# Patient Record
Sex: Female | Born: 1980 | Hispanic: Yes | Marital: Single | Smoking: Never smoker
Health system: Southern US, Community
[De-identification: ages and names within clinical notes are randomized; demographics above are authoritative.]

## PROBLEM LIST (undated history)

## (undated) ENCOUNTER — Inpatient Hospital Stay (HOSPITAL_COMMUNITY): Payer: Medicaid Other

## (undated) DIAGNOSIS — E119 Type 2 diabetes mellitus without complications: Secondary | ICD-10-CM

## (undated) HISTORY — DX: Type 2 diabetes mellitus without complications: E11.9

---

## 2009-12-18 DIAGNOSIS — R739 Hyperglycemia, unspecified: Secondary | ICD-10-CM

## 2018-05-08 ENCOUNTER — Other Ambulatory Visit: Payer: Self-pay

## 2018-05-08 ENCOUNTER — Emergency Department (HOSPITAL_COMMUNITY)
Admission: EM | Admit: 2018-05-08 | Discharge: 2018-05-08 | Disposition: A | Discharge status: Home / Self Care | Payer: Medicaid Other | Attending: Emergency Medicine | Admitting: Emergency Medicine

## 2018-05-08 ENCOUNTER — Encounter (HOSPITAL_COMMUNITY): Payer: Self-pay | Admitting: Emergency Medicine

## 2018-05-08 DIAGNOSIS — R112 Nausea with vomiting, unspecified: Secondary | ICD-10-CM | POA: Insufficient documentation

## 2018-05-08 DIAGNOSIS — E119 Type 2 diabetes mellitus without complications: Secondary | ICD-10-CM | POA: Diagnosis not present

## 2018-05-08 HISTORY — DX: Type 2 diabetes mellitus without complications: E11.9

## 2018-05-08 MED ORDER — ONDANSETRON 4 MG PO TBDP
4.0000 mg | ORAL_TABLET | Freq: Once | ORAL | Status: AC
  Administered 2018-05-08: 4 mg via ORAL
  Filled 2018-05-08: qty 1

## 2018-05-08 MED ORDER — ONDANSETRON 4 MG PO TBDP: 4.0000 mg | ORAL_TABLET | Freq: Three times a day (TID) | ORAL | 0 refills | Status: DC | PRN

## 2018-05-08 NOTE — ED Notes (Signed)
Pt also states she has been having nose bleeds and a headache. Pt has history of diabetes

## 2018-05-08 NOTE — Discharge Instructions (Signed)
Please read instructions below. Drink clear liquids until your stomach feels better. Then, slowly introduce bland foods into your diet as tolerated, such as bread, rice, apples, bananas. You can take zofran every 8 hours as needed for nausea. Follow up with your primary care if symptoms persist. Return to the ER for severe abdominal pain, fever, uncontrollable vomiting, or new or concerning symptoms.  Por favor, lea las instrucciones a continuacin. Beba lquidos claros hasta que su estmago se sienta mejor. Luego, introduzca lentamente alimentos suaves en su dieta segn lo tolerado, como pan, arroz, manzanas, pltanos. Puede tomar zofran cada 8 horas segn sea necesario para las nuseas. Haga un seguimiento con su atencin primaria si los sntomas persisten. Regrese a la sala de emergencias por dolor abdominal intenso, fiebre, vmitos incontrolables o sntomas nuevos o preocupantes.

## 2018-05-08 NOTE — ED Notes (Signed)
Pt able to tolerate fluids 

## 2018-05-08 NOTE — ED Provider Notes (Signed)
[facility] EMERGENCY DEPARTMENT Provider Note   CSN: [id number] Arrival date & time: 05/08/18  1101     History   Chief Complaint No chief complaint on file.   HPI Lisa [name] with past medical history of DM, presenting to the emergency department with complaint of 1 day of nausea and vomiting.  Patient states yesterday she began having nausea with associated nonbloody nonbilious emesis.  She has associated epigastric abdominal discomfort which she states feels like reflux.  She is treated symptoms with Tylenol with improvement.  She endorses a fever, however states her temperature was 98.44F.  No hx abdominal surgeries. Denies diarrhea, urinary sx. No sick contacts.   The history is provided by the patient.    Past Medical History:  Diagnosis Date  . Diabetes mellitus without complication (HCC)     There are no active problems to display for this patient.   History reviewed. No pertinent surgical history.   OB History   No obstetric history on file.      Home Medications    Prior to Admission medications   Medication Sig Start Date End Date Taking? Authorizing Provider  ondansetron (ZOFRAN ODT) 4 MG disintegrating tablet Take 1 tablet (4 mg total) by mouth every 8 (eight) hours as needed for nausea or vomiting. 05/08/18   Brecklyn Galvis, [place] N, PA-C    Family History No family history on file.  Social History Social History   Tobacco Use  . Smoking status: Not on file  Substance Use Topics  . Alcohol use: Not on file  . Drug use: Not on file     Allergies   Patient has no allergy information on record.   Review of Systems Review of Systems  Constitutional: Negative for fever.  Gastrointestinal: Positive for abdominal pain, nausea and vomiting. Negative for diarrhea.  Genitourinary: Negative for dysuria and frequency.  All other systems reviewed and are negative.    Physical Exam Updated Vital  Signs BP 108/82   Pulse 88   Temp 98.5 F (36.9 C) (Oral)   Ht 5\' 6"  (1.676 m)   Wt 61.2 kg   SpO2 100%   BMI 21.79 kg/m   Physical Exam Vitals signs and nursing note reviewed.  Constitutional:      General: She is not in acute distress.    Appearance: She is well-developed. She is not ill-appearing.  HENT:     Head: Normocephalic and atraumatic.     Mouth/Throat:     Mouth: Mucous membranes are moist.  Eyes:     Conjunctiva/sclera: Conjunctivae normal.  Cardiovascular:     Rate and Rhythm: Normal rate and regular rhythm.  Pulmonary:     Effort: Pulmonary effort is normal.     Breath sounds: Normal breath sounds.  Abdominal:     General: Bowel sounds are normal. There is no distension.     Palpations: Abdomen is soft. There is no mass.     Tenderness: There is abdominal tenderness (generalized). There is no guarding or rebound.  Skin:    General: Skin is warm.  Neurological:     Mental Status: She is alert.  Psychiatric:        Behavior: Behavior normal.      ED Treatments / Results  Labs (all labs ordered are listed, but only abnormal results are displayed) Labs Reviewed - No data to display  EKG None  Radiology No results found.  Procedures Procedures (including critical  care time)  Medications Ordered in ED Medications  ondansetron (ZOFRAN-ODT) disintegrating tablet 4 mg (4 mg Oral Given 05/08/18 1246)     Initial Impression / Assessment and Plan / ED Course  I have reviewed the triage vital signs and the nursing notes.  Pertinent labs & imaging results that were available during my care of the patient were reviewed by me and considered in my medical decision making (see chart for details).     Patient with symptoms consistent with viral gastroenteritis.  Vitals are stable, no fever.  No signs of dehydration, tolerating PO fluids > 6 oz.  Lungs are clear.  No focal abdominal tenderness, no concern for appendicitis, cholecystitis, pancreatitis,  ruptured viscus, UTI, kidney stone, or any other abdominal etiology.  Supportive therapy indicated with return if symptoms worsen.  Patient counseled.  Discussed results, findings, treatment and follow up. Patient advised of return precautions. Patient verbalized understanding and agreed with plan.  Final Clinical Impressions(s) / ED Diagnoses   Final diagnoses:  Non-intractable vomiting with nausea, unspecified vomiting type    ED Discharge Orders         Ordered    ondansetron (ZOFRAN ODT) 4 MG disintegrating tablet  Every 8 hours PRN     05/08/18 1351           Eulis Salazar, [place] N, PA-C 05/08/18 1359    Azalia Bilis, MD 05/08/18 1642

## 2018-05-08 NOTE — ED Triage Notes (Signed)
Pt presents to ED from home. Pt complains of of N/V and fever since last night. Pt complains of no pain. Pt states her fever was 98.6.

## 2018-05-08 NOTE — ED Notes (Signed)
Pt given ginger ale to drink. 

## 2018-05-08 NOTE — ED Notes (Signed)
Patient verbalizes understanding of discharge instructions. Opportunity for questioning and answers were provided. Armband removed by staff, pt discharged from ED ambulatory to home.  

## 2018-05-29 ENCOUNTER — Ambulatory Visit (INDEPENDENT_AMBULATORY_CARE_PROVIDER_SITE_OTHER): Payer: Medicaid Other | Admitting: *Deleted

## 2018-05-29 ENCOUNTER — Telehealth: Payer: Self-pay | Admitting: General Practice

## 2018-05-29 ENCOUNTER — Encounter: Payer: Self-pay | Admitting: General Practice

## 2018-05-29 VITALS — BP 124/81 | HR 99 | Temp 97.5°F | Ht 66.0 in | Wt 141.4 lb

## 2018-05-29 DIAGNOSIS — Z3200 Encounter for pregnancy test, result unknown: Secondary | ICD-10-CM

## 2018-05-29 DIAGNOSIS — Z3201 Encounter for pregnancy test, result positive: Secondary | ICD-10-CM | POA: Diagnosis not present

## 2018-05-29 LAB — POCT URINE PREGNANCY: Preg Test, Ur: POSITIVE — AB

## 2018-05-29 NOTE — Telephone Encounter (Signed)
Charity application and information about [employer] program given to patient.

## 2018-05-29 NOTE — Progress Notes (Signed)
   [name] presents today for UPT. She has no unusual complaints. LMP:04/09/2018    OBJECTIVE: Appears well, in no apparent distress.  OB History    Gravida  3   Para  2   Term  1   Preterm  1   AB      Living  2     SAB      TAB      Ectopic      Multiple      Live Births  2          Home UPT Result: Positive In-Office UPT result:Positive I have reviewed the patient's medical, obstetrical, social, and family histories, and medications.   ASSESSMENT: Positive pregnancy test  PLAN Prenatal care to be completed at: [facility] due to history of DM with insulin administration. Start PNV.  Lisa Pu, RN

## 2018-06-13 ENCOUNTER — Ambulatory Visit: Payer: Medicaid Other | Admitting: *Deleted

## 2018-06-13 ENCOUNTER — Encounter: Payer: Medicaid Other | Attending: Obstetrics and Gynecology | Admitting: *Deleted

## 2018-06-13 ENCOUNTER — Other Ambulatory Visit: Payer: Self-pay | Admitting: Obstetrics and Gynecology

## 2018-06-13 DIAGNOSIS — O24410 Gestational diabetes mellitus in pregnancy, diet controlled: Secondary | ICD-10-CM

## 2018-06-13 DIAGNOSIS — O24011 Pre-existing diabetes mellitus, type 1, in pregnancy, first trimester: Secondary | ICD-10-CM

## 2018-06-13 DIAGNOSIS — Z713 Dietary counseling and surveillance: Secondary | ICD-10-CM | POA: Diagnosis not present

## 2018-06-13 MED ORDER — ACCU-CHEK GUIDE W/DEVICE KIT: 1.0000 | PACK | Freq: Once | 0 refills | Status: AC

## 2018-06-13 MED ORDER — GLUCOSE BLOOD VI STRP: ORAL_STRIP | 12 refills | Status: AC

## 2018-06-13 MED ORDER — ACCU-CHEK FASTCLIX LANCETS MISC: 1.0000 | Freq: Four times a day (QID) | 12 refills | Status: DC

## 2018-06-13 MED ORDER — INSULIN GLARGINE 100 UNIT/ML ~~LOC~~ SOLN: 35.0000 [IU] | Freq: Every day | SUBCUTANEOUS | 11 refills | Status: DC

## 2018-06-13 MED ORDER — "INSULIN SYRINGE 30G X 5/16"" 0.5 ML MISC": 1.0000 | 5 refills | Status: DC

## 2018-06-13 MED ORDER — INSULIN PEN NEEDLE 30G X 8 MM MISC: 1.0000 | Status: AC | PRN

## 2018-06-14 NOTE — Progress Notes (Signed)
Patient was seen on 06/13/2018 for type 1 Diabetes and pregnancy self-management. EDD 01/14/2019. Patient speaks Spanish, we started appointment with [facility] interpretor, then live interpretor was able to complete visit.  Patient states history of this diabetes for 13 years.  Diet history obtained. Patient eats good variety of all food groups and beverages include coffee with cream and water.  Her comfort level with carb counting is 5/10. Patient is currently on 35 units Lantus / Basaglar insulin at night for her diabetes medications. She states she works for a [employer], [employer] from 8 - 5 PM so she has moderate activity all day. She states she has hypoglycemia about once a week or less lately.  She recently moved here from [place] and lost her meter, she states she hasn't checked her blood sugar in about 2 months. 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 She does have Medicaid, and Lantus is covered so I will discuss with MD today about switching back to Lantus. When asked about any history of DKA, she did not know what that meant. Once I described it, she said no history.  The following learning objectives were met by the patient :   States the definition of type 1 Diabetes and pregnancy   States why dietary management is important in controlling blood glucose  Describes the effects of carbohydrates on blood glucose levels  Demonstrates ability to create a balanced meal plan  Demonstrates carbohydrate counting   States when to check blood glucose levels  Demonstrates proper blood glucose monitoring techniques  States the effect of stress and exercise on blood glucose levels  States the importance of limiting caffeine and abstaining from alcohol and smoking  I discussed with Dr. Arlina Robes her need for a Rx for insulin, he placed order for Lantus pens with pen needles @ 35 units at night.  She states she is aware of pumps but  never used one. I mentioned possibility of Omnipod, but plan to discuss in more detail at future visit if she wishes.   Plan:   Aim for 3 Carb Choices per meal (45 grams) +/- 1 either way   Aim for 1-2 Carbs per snack  Begin reading food labels for Total Carbohydrate of foods  Consider  increasing your activity level by walking or other activity daily as tolerated  Begin checking BG before breakfast and 2 hours after first bite of breakfast, lunch and dinner as directed by MD   Bring Log Book/Sheet to every medical appointment    Patient not appropriate for Baby Scripts due to having type 1 diabetes  Take medication as directed by MD  Blood glucose monitor Rx called into pharmacy: [place] with Fast Clix drums Patient instructed to test pre breakfast and 2 hours each meal as directed by MD  Patient instructed to monitor glucose levels: FBS: 60 - 95 mg/dl 2 hour: <120 mg/dl  Patient received the following handouts:  Nutrition Diabetes and Pregnancy  Carbohydrate Counting List  Patient will be seen for follow-up in 1 month and as needed.

## 2018-06-21 ENCOUNTER — Emergency Department (HOSPITAL_COMMUNITY)
Admission: EM | Admit: 2018-06-21 | Discharge: 2018-06-22 | Disposition: A | Discharge status: Home / Self Care | Payer: Medicaid Other | Attending: Emergency Medicine | Admitting: Emergency Medicine

## 2018-06-21 ENCOUNTER — Encounter (HOSPITAL_COMMUNITY): Payer: Self-pay | Admitting: *Deleted

## 2018-06-21 ENCOUNTER — Other Ambulatory Visit: Payer: Self-pay

## 2018-06-21 DIAGNOSIS — O23591 Infection of other part of genital tract in pregnancy, first trimester: Secondary | ICD-10-CM | POA: Diagnosis not present

## 2018-06-21 DIAGNOSIS — O24111 Pre-existing diabetes mellitus, type 2, in pregnancy, first trimester: Secondary | ICD-10-CM | POA: Insufficient documentation

## 2018-06-21 DIAGNOSIS — Z794 Long term (current) use of insulin: Secondary | ICD-10-CM | POA: Diagnosis not present

## 2018-06-21 DIAGNOSIS — B9689 Other specified bacterial agents as the cause of diseases classified elsewhere: Secondary | ICD-10-CM | POA: Insufficient documentation

## 2018-06-21 DIAGNOSIS — E119 Type 2 diabetes mellitus without complications: Secondary | ICD-10-CM | POA: Insufficient documentation

## 2018-06-21 DIAGNOSIS — Z3A12 12 weeks gestation of pregnancy: Secondary | ICD-10-CM | POA: Insufficient documentation

## 2018-06-21 DIAGNOSIS — R1032 Left lower quadrant pain: Secondary | ICD-10-CM

## 2018-06-21 DIAGNOSIS — N760 Acute vaginitis: Secondary | ICD-10-CM

## 2018-06-21 LAB — CBG MONITORING, ED: Glucose-Capillary: 91 mg/dL (ref 70–99)

## 2018-06-21 NOTE — ED Notes (Signed)
Pt remains in waiting room. Updated on wait for treatment room. 

## 2018-06-21 NOTE — ED Triage Notes (Addendum)
Pt was at work. Pt lost her footing at work and caught herself with her leg, did not fall onto her abd but has been having pain since incident. Pt is 2 months [redacted] weeks pregnant. Denies vaginal bleeding or abnormal discharge

## 2018-06-22 ENCOUNTER — Emergency Department (HOSPITAL_COMMUNITY): Payer: Medicaid Other

## 2018-06-22 LAB — COMPREHENSIVE METABOLIC PANEL
ALT: 16 U/L (ref 0–44)
AST: 18 U/L (ref 15–41)
Albumin: 3.2 g/dL — ABNORMAL LOW (ref 3.5–5.0)
Alkaline Phosphatase: 38 U/L (ref 38–126)
Anion gap: 8 (ref 5–15)
BUN: 5 mg/dL — ABNORMAL LOW (ref 6–20)
CHLORIDE: 105 mmol/L (ref 98–111)
CO2: 20 mmol/L — ABNORMAL LOW (ref 22–32)
Calcium: 8.7 mg/dL — ABNORMAL LOW (ref 8.9–10.3)
Creatinine, Ser: 0.52 mg/dL (ref 0.44–1.00)
GFR calc Af Amer: >60 mL/min (ref >60)
GFR calc non Af Amer: >60 mL/min (ref >60)
GLUCOSE: 39 mg/dL — AB (ref 70–99)
Potassium: 3.3 mmol/L — ABNORMAL LOW (ref 3.5–5.1)
Sodium: 133 mmol/L — ABNORMAL LOW (ref 135–145)
Total Bilirubin: 0.5 mg/dL (ref 0.3–1.2)
Total Protein: 6.9 g/dL (ref 6.5–8.1)

## 2018-06-22 LAB — URINALYSIS, ROUTINE W REFLEX MICROSCOPIC
Bilirubin Urine: NEGATIVE
Glucose, UA: NEGATIVE mg/dL
Hgb urine dipstick: NEGATIVE
Ketones, ur: NEGATIVE mg/dL
Leukocytes,Ua: NEGATIVE
Nitrite: NEGATIVE
Protein, ur: NEGATIVE mg/dL
SPECIFIC GRAVITY, URINE: 1.004 — AB (ref 1.005–1.030)
pH: 7 (ref 5.0–8.0)

## 2018-06-22 LAB — CBC WITH DIFFERENTIAL/PLATELET
Abs Immature Granulocytes: 0.02 10*3/uL (ref 0.00–0.07)
Basophils Absolute: 0.1 10*3/uL (ref 0.0–0.1)
Basophils Relative: 1 %
Eosinophils Absolute: 0.2 10*3/uL (ref 0.0–0.5)
Eosinophils Relative: 2 %
HCT: 29 % — ABNORMAL LOW (ref 36.0–46.0)
Hemoglobin: 8.4 g/dL — ABNORMAL LOW (ref 12.0–15.0)
IMMATURE GRANULOCYTES: 0 %
Lymphocytes Relative: 18 %
Lymphs Abs: 1.7 10*3/uL (ref 0.7–4.0)
MCH: 18.8 pg — ABNORMAL LOW (ref 26.0–34.0)
MCHC: 29 g/dL — ABNORMAL LOW (ref 30.0–36.0)
MCV: 64.9 fL — ABNORMAL LOW (ref 80.0–100.0)
Monocytes Absolute: 1.4 10*3/uL — ABNORMAL HIGH (ref 0.1–1.0)
Monocytes Relative: 15 %
Neutro Abs: 5.8 10*3/uL (ref 1.7–7.7)
Neutrophils Relative %: 64 %
PLATELETS: 210 10*3/uL (ref 150–400)
RBC: 4.47 MIL/uL (ref 3.87–5.11)
RDW: 21.3 % — ABNORMAL HIGH (ref 11.5–15.5)
WBC: 9.1 10*3/uL (ref 4.0–10.5)
nRBC: 0 % (ref 0.0–0.2)

## 2018-06-22 LAB — ABO/RH
ABO/RH(D): A NEG
ANTIBODY SCREEN: NEGATIVE

## 2018-06-22 LAB — WET PREP, GENITAL
Sperm: NONE SEEN
Trich, Wet Prep: NONE SEEN
Yeast Wet Prep HPF POC: NONE SEEN

## 2018-06-22 LAB — CBG MONITORING, ED: Glucose-Capillary: 153 mg/dL — ABNORMAL HIGH (ref 70–99)

## 2018-06-22 MED ORDER — METRONIDAZOLE 500 MG PO TABS: 500.0000 mg | ORAL_TABLET | Freq: Two times a day (BID) | ORAL | 0 refills | Status: DC

## 2018-06-22 MED ORDER — METRONIDAZOLE 500 MG PO TABS: 2000.0000 mg | ORAL_TABLET | Freq: Once | ORAL | Status: DC

## 2018-06-22 MED ORDER — ACETAMINOPHEN 500 MG PO TABS
1000.0000 mg | ORAL_TABLET | Freq: Once | ORAL | Status: AC
  Administered 2018-06-22: 1000 mg via ORAL
  Filled 2018-06-22: qty 2

## 2018-06-22 MED ORDER — METRONIDAZOLE 500 MG PO TABS
500.0000 mg | ORAL_TABLET | Freq: Once | ORAL | Status: AC
  Administered 2018-06-22: 500 mg via ORAL
  Filled 2018-06-22: qty 1

## 2018-06-22 NOTE — ED Provider Notes (Addendum)
[facility] EMERGENCY DEPARTMENT Provider Note  CSN: [id number] Arrival date & time: 06/21/18 1835  Chief Complaint(s) Abdominal Pain  HPI Lisa [name] g3p2 at approx 10wk who presents to the emergency department with left-sided abdominal pain.  She reports that while walking she missed a step which caused her to her trip.  Incident occurred at approximately 1500 this afternoon.  She states that she was able to catch herself but strained while doing so.  Since then her abdomen has been aching.  Pain is moderate in intensity.  Exacerbated with movement and palpation.  She endorses associated nausea and nonbloody nonbilious emesis.  She denies any vaginal bleeding.  No urinary symptoms.  No chest pain or shortness of breath.  She reports that she is scheduled for her first OB visit next week.  Denies any prior sexual transmitted disease.  Less sexual relations was earlier this morning.  HPI  Past Medical History Past Medical History:  Diagnosis Date  . Diabetes mellitus without complication (HCC)    There are no active problems to display for this patient.  Home Medication(s) Prior to Admission medications   Medication Sig Start Date End Date Taking? Authorizing Provider  insulin glargine (LANTUS) 100 UNIT/ML injection Inject 0.35 mLs (35 Units total) into the skin daily. 06/13/18  Yes Hermina Staggers, MD  ACCU-CHEK FASTCLIX LANCETS MISC 1 Device by Percutaneous route 4 (four) times daily. 06/13/18   Hermina Staggers, MD  glucose blood (ACCU-CHEK GUIDE) test strip Use as instructed QID 06/13/18   Hermina Staggers, MD  Insulin Syringe-Needle U-100 (INSULIN SYRINGE .5CC/30GX5/16") 30G X 5/16" 0.5 ML MISC 1 Device by Does not apply route as directed. 06/13/18   Hermina Staggers, MD  metroNIDAZOLE (FLAGYL) 500 MG tablet Take 1 tablet (500 mg total) by mouth 2 (two) times daily. 06/22/18   Nira Conn, MD  ondansetron (ZOFRAN ODT) 4 MG  disintegrating tablet Take 1 tablet (4 mg total) by mouth every 8 (eight) hours as needed for nausea or vomiting. Patient not taking: Reported on 06/22/2018 05/08/18   Robinson, [place] N, PA-C                                                                                                                                    Past Surgical History History reviewed. No pertinent surgical history. Family History No family history on file.  Social History Social History   Tobacco Use  . Smoking status: Never Smoker  . Smokeless tobacco: Never Used  Substance Use Topics  . Alcohol use: Never    Frequency: Never  . Drug use: Never   Allergies Penicillins  Review of Systems Review of Systems All other systems are reviewed and are negative for acute change except as noted in the HPI  Physical Exam Vital Signs  I have reviewed the triage vital signs BP 125/74 (BP  Location: Right Arm)   Pulse (!) 102   Temp 98 F (36.7 C) (Oral)   Resp 16   LMP 04/09/2018 (Exact Date)   SpO2 100%    Physical Exam Vitals signs reviewed. Exam conducted with a chaperone present.  Constitutional:      General: She is not in acute distress.    Appearance: She is well-developed. She is not diaphoretic.  HENT:     Head: Normocephalic and atraumatic.     Right Ear: External ear normal.     Left Ear: External ear normal.     Nose: Nose normal.  Eyes:     General: No scleral icterus.    Conjunctiva/sclera: Conjunctivae normal.  Neck:     Musculoskeletal: Normal range of motion.     Trachea: Phonation normal.  Cardiovascular:     Rate and Rhythm: Normal rate and regular rhythm.  Pulmonary:     Effort: Pulmonary effort is normal. No respiratory distress.     Breath sounds: No stridor.  Abdominal:     General: There is no distension.     Tenderness: There is abdominal tenderness in the suprapubic area and left lower quadrant. There is no guarding or rebound.  Genitourinary:    Vagina: No foreign  body. Vaginal discharge (mucous, white) present. No erythema, bleeding or lesions.     Cervix: Normal.     Uterus: Normal.      Adnexa: Right adnexa normal and left adnexa normal.  Musculoskeletal: Normal range of motion.  Neurological:     Mental Status: She is alert and oriented to person, place, and time.  Psychiatric:        Behavior: Behavior normal.     ED Results and Treatments Labs (all labs ordered are listed, but only abnormal results are displayed) Labs Reviewed  WET PREP, GENITAL - Abnormal; Notable for the following components:      Result Value   Clue Cells Wet Prep HPF POC PRESENT (*)    WBC, Wet Prep HPF POC MODERATE (*)    All other components within normal limits  CBC WITH DIFFERENTIAL/PLATELET - Abnormal; Notable for the following components:   Hemoglobin 8.4 (*)    HCT 29.0 (*)    MCV 64.9 (*)    MCH 18.8 (*)    MCHC 29.0 (*)    RDW 21.3 (*)    Monocytes Absolute 1.4 (*)    All other components within normal limits  COMPREHENSIVE METABOLIC PANEL - Abnormal; Notable for the following components:   Sodium 133 (*)    Potassium 3.3 (*)    CO2 20 (*)    Glucose, Bld 39 (*)    BUN 5 (*)    Calcium 8.7 (*)    Albumin 3.2 (*)    All other components within normal limits  URINALYSIS, ROUTINE W REFLEX MICROSCOPIC - Abnormal; Notable for the following components:   Color, Urine STRAW (*)    Specific Gravity, Urine 1.004 (*)    All other components within normal limits  CBG MONITORING, ED - Abnormal; Notable for the following components:   Glucose-Capillary 153 (*)    All other components within normal limits  CBG MONITORING, ED  ABO/RH  GC/CHLAMYDIA PROBE AMP ([place]) NOT AT [facility]  EKG  EKG Interpretation  Date/Time:    Ventricular Rate:    PR Interval:    QRS Duration:   QT Interval:    QTC Calculation:   R Axis:     Text  Interpretation:        Radiology [place] Art/ven Flow Abd Pelv Doppler  Result Date: 06/22/2018 CLINICAL DATA:  38 y/o F; positive urine pregnancy test. Pelvic pain. EXAM: OBSTETRIC <14 WK [place] AND TRANSVAGINAL OB US DOPPLER ULTRASOUND OF OVARIES TECHNIQUE: Both transabdominal and transvaginal ultrasound examinations were performed for complete evaluation of the gestation as well as the maternal uterus, adnexal regions, and pelvic cul-de-sac. Transvaginal technique was performed to assess early pregnancy. Color and duplex Doppler ultrasound was utilized to evaluate blood flow to the ovaries. COMPARISON:  None. FINDINGS: Intrauterine gestational sac: Single Yolk sac:  Not Visualized. Embryo:  Visualized. Cardiac Activity: Visualized. Heart Rate: 171 bpm CRL: 60.6 mm   12 w   4 d                  [place] EDC: 12/31/2018 Subchorionic hemorrhage:  None visualized. Maternal uterus/adnexae: Right ovary measures 3.3 x 2.5 x 2.8 cm, 11.9 cc. Left ovary measures 2.8 x 1.7 x 2.3 cm, 5.6 cc. Normal appearance of the adnexa and uterus. Pulsed Doppler evaluation of both ovaries demonstrates normal appearing low-resistance arterial and venous waveforms. IMPRESSION: Single live intrauterine pregnancy with estimated gestational age of [redacted] weeks and 4 days. Electronically Signed   By: Mitzi Hansen M.D.   On: 06/22/2018 02:43   Pertinent labs & imaging results that were available during my care of the patient were reviewed by me and considered in my medical decision making (see chart for details).  Medications Ordered in ED Medications  acetaminophen (TYLENOL) tablet 1,000 mg (1,000 mg Oral Given 06/22/18 0146)  metroNIDAZOLE (FLAGYL) tablet 500 mg (500 mg Oral Given 06/22/18 0432)                                                                                                                                    Procedures Procedures EMERGENCY DEPARTMENT [place] PREGNANCY "Study: Limited Ultrasound of the Pelvis for  Pregnancy"  INDICATIONS:Pregnancy(required) Multiple views of the uterus and pelvic cavity were obtained in real-time with a multi-frequency probe.  APPROACH:Transabdominal  PERFORMED BY: Myself IMAGES ARCHIVED?: Yes LIMITATIONS: none PREGNANCY FREE FLUID: None FETAL HEART RATE: 150 INTERPRETATION: active fetal heart movement and heart tones     (including critical care time)  Medical Decision Making / ED Course I have reviewed the nursing notes for this encounter and the patient's prior records (if available in EHR or on provided paperwork).    Patient presents with left-sided abdominal pain after straining to prevent herself from falling.  No direct trauma to the abdomen.  On exam patient has discomfort to the suprapubic region and left lower quadrant.  On pelvic exam there is no vaginal bleeding.  There is some white mucus discharge.  Cervical office is closed.  Exam not consistent with PID.  Bedside ultrasound revealed active fetal movement with fetal heart tones in the 150s.  Screening labs obtained and grossly reassuring without leukocytosis.  Patient does have microcytic anemia (no prior CBC for comparison).  Mild hyponatremia and hypokalemia on metabolic panel.  No renal insufficiency.  Urine without evidence of infection and no hematuria.  Wet prep with evidence of bacterial vaginosis.  Will treat with Flagyl.  Low suspicion for serious intra-abdominal inflammatory/infectious process.  Will obtain pelvic ultrasound to assess for possible torsion.  Pelvic ultrasound without ovarian torsion or TOA.  During the work-up it was noted the patient was hypoglycemic provided with food and CBGs improved. Tolerated PO intake.  Final Clinical Impression(s) / ED Diagnoses Final diagnoses:  LLQ pain  Bacterial vaginosis    Disposition: Discharge  Condition: Good  I have discussed the results, Dx and Tx plan with the patient who expressed understanding and agree(s) with the plan.  Discharge instructions discussed at great length. The patient was given strict return precautions who verbalized understanding of the instructions. No further questions at time of discharge.    ED Discharge Orders         Ordered    metroNIDAZOLE (FLAGYL) 500 MG tablet  2 times daily     06/22/18 0531           Follow Up: Obstetrician   as scheduled     This chart was dictated using voice recognition software.  Despite best efforts to proofread,  errors can occur which can change the documentation meaning.     Nira Conn, MD 06/22/18 [phone]

## 2018-06-24 LAB — GC/CHLAMYDIA PROBE AMP (~~LOC~~) NOT AT ARMC
Chlamydia: NEGATIVE
Neisseria Gonorrhea: NEGATIVE

## 2018-06-28 ENCOUNTER — Encounter: Payer: Self-pay | Admitting: Obstetrics & Gynecology

## 2018-06-28 ENCOUNTER — Ambulatory Visit (INDEPENDENT_AMBULATORY_CARE_PROVIDER_SITE_OTHER): Payer: Medicaid Other | Admitting: Obstetrics & Gynecology

## 2018-06-28 VITALS — BP 127/86 | HR 101 | Wt 142.9 lb

## 2018-06-28 DIAGNOSIS — Z3A13 13 weeks gestation of pregnancy: Secondary | ICD-10-CM | POA: Diagnosis not present

## 2018-06-28 DIAGNOSIS — Z789 Other specified health status: Secondary | ICD-10-CM | POA: Insufficient documentation

## 2018-06-28 DIAGNOSIS — O219 Vomiting of pregnancy, unspecified: Secondary | ICD-10-CM | POA: Diagnosis not present

## 2018-06-28 DIAGNOSIS — O24311 Unspecified pre-existing diabetes mellitus in pregnancy, first trimester: Secondary | ICD-10-CM

## 2018-06-28 DIAGNOSIS — O09529 Supervision of elderly multigravida, unspecified trimester: Secondary | ICD-10-CM | POA: Insufficient documentation

## 2018-06-28 DIAGNOSIS — O09521 Supervision of elderly multigravida, first trimester: Secondary | ICD-10-CM

## 2018-06-28 DIAGNOSIS — Z23 Encounter for immunization: Secondary | ICD-10-CM | POA: Diagnosis not present

## 2018-06-28 DIAGNOSIS — O0991 Supervision of high risk pregnancy, unspecified, first trimester: Secondary | ICD-10-CM

## 2018-06-28 DIAGNOSIS — O0990 Supervision of high risk pregnancy, unspecified, unspecified trimester: Secondary | ICD-10-CM | POA: Insufficient documentation

## 2018-06-28 DIAGNOSIS — O24319 Unspecified pre-existing diabetes mellitus in pregnancy, unspecified trimester: Secondary | ICD-10-CM

## 2018-06-28 MED ORDER — METOCLOPRAMIDE HCL 10 MG PO TABS: 10.0000 mg | ORAL_TABLET | Freq: Four times a day (QID) | ORAL | 2 refills | Status: DC | PRN

## 2018-06-28 MED ORDER — PROMETHAZINE HCL 25 MG PO TABS: 25.0000 mg | ORAL_TABLET | Freq: Four times a day (QID) | ORAL | 2 refills | Status: DC | PRN

## 2018-06-28 MED ORDER — ASPIRIN EC 81 MG PO TBEC: 81.0000 mg | DELAYED_RELEASE_TABLET | Freq: Every day | ORAL | 2 refills | Status: DC

## 2018-06-28 NOTE — Progress Notes (Signed)
History:   Lisa Blake is a 38 y.o. G3P2002 at [redacted]w[redacted]d by 12 week ultrasound not consistent with LMP being seen today for her first obstetrical visit. Patient is Spanish-speaking only, Spanish interpreter present for this encounter. Her obstetrical history is significant for Type II DM, insulin dependent and two previous cesarean sections. Patient does intend to breast feed. Pregnancy history fully reviewed.  Patient reports nausea and vomiting, desires medication for this.     HISTORY: OB History  Gravida Para Term Preterm AB Living  3 2 1 1  0 2  SAB TAB Ectopic Multiple Live Births  0 0 0 0 2    # Outcome Date GA Lbr Len/2nd Weight Sex Delivery Anes PTL Lv  3 Current           2 Preterm 12/18/09 [redacted]w[redacted]d   M CS-LTranv  N LIV     Complications: Hyperglycemia  1 Term 12/16/04 [redacted]w[redacted]d   F CS-LTranv  N LIV    [place] pap smear [place] in [place] last year and was normal  Past Medical History:  Diagnosis Date  . Diabetes mellitus without complication (HCC)   . DM II (diabetes mellitus, type II), controlled (HCC)    Diagnosed after pregnancy, was 38 years old   Past Surgical History:  Procedure Laterality Date  . CESAREAN SECTION     Family History  Problem Relation Age of Onset  . Hypertension Mother   . Diabetes Father    Social History   Tobacco Use  . Smoking status: Never Smoker  . Smokeless tobacco: Never Used  Substance Use Topics  . Alcohol use: Never    Frequency: Never  . Drug use: Never   Allergies  Allergen Reactions  . Penicillins     Unknown reaction   Current Outpatient Medications on File Prior to Visit  Medication Sig Dispense Refill  . ACCU-CHEK FASTCLIX LANCETS MISC 1 Device by Percutaneous route 4 (four) times daily. 100 each 12  . glucose blood (ACCU-CHEK GUIDE) test strip Use as instructed QID 100 each 12  . insulin glargine (LANTUS) 100 UNIT/ML injection Inject 0.35 mLs (35 Units total) into the skin daily. 10 mL 11  . Insulin  Syringe-Needle U-100 (INSULIN SYRINGE .5CC/30GX5/16") 30G X 5/16" 0.5 ML MISC 1 Device by Does not apply route as directed. 100 each 5  . metroNIDAZOLE (FLAGYL) 500 MG tablet Take 1 tablet (500 mg total) by mouth 2 (two) times daily. 14 tablet 0  . ondansetron (ZOFRAN ODT) 4 MG disintegrating tablet Take 1 tablet (4 mg total) by mouth every 8 (eight) hours as needed for nausea or vomiting. (Patient not taking: Reported on 06/22/2018) 20 tablet 0   Current Facility-Administered Medications on File Prior to Visit  Medication Dose Route Frequency Provider Last Rate Last Dose  . Insulin Pen Needle (NOVOFINE) 10 each  1 packet Subcutaneous PRN Hermina Staggers, MD        Review of Systems Pertinent items noted in HPI and remainder of comprehensive ROS otherwise negative. Physical Exam:   Vitals:   06/28/18 1057  BP: 127/86  Pulse: (!) 101  Weight: 142 lb 14.4 oz (64.8 kg)   Fetal Heart Rate (bpm): 155 General: well-developed, well-nourished female in no acute distress  Breasts:  normal appearance, no masses or tenderness bilaterally  Skin: normal coloration and turgor, no rashes  Neurologic: oriented, normal, negative, normal mood  Extremities: normal strength, tone, and muscle mass, ROM of all joints is normal  HEENT  PERRLA, extraocular movement intact and sclera clear, anicteric  Mouth/Teeth mucous membranes moist, pharynx normal without lesions and dental hygiene good  Neck supple and no masses  Cardiovascular: regular rate and rhythm  Respiratory:  no respiratory distress, normal breath sounds  Abdomen: soft, non-tender; bowel sounds normal; no masses,  no organomegaly  Pelvic: deferred     Assessment:    Pregnancy: [id number] Patient Active Problem List   Diagnosis Date Noted  . Supervision of high risk pregnancy, antepartum 06/28/2018  . Preexisting diabetes complicating pregnancy, antepartum 06/28/2018  . AMA (advanced maternal age) multigravida 35+ 06/28/2018  . Language  barrier, speaks Spanish only 06/28/2018    Plan:    1. Preexisting diabetes complicating pregnancy, antepartum Current Diabetic Medications:  Insulin . Did not bring log today but reports values within range. Will evaluate next visit [ ]  Aspirin 81 mg daily after 12 weeks - ordered [X]  Diabetes Education and Testing Supplies [X]  Nutrition Counsult [ ]  Fetal ECHO after 20 weeks  - ordered [ ]  Eye exam for retina evaluation - ordered - Comprehensive metabolic panel - Hemoglobin A1c - TSH - Protein / creatinine ratio, urine - US Fetal Echocardiography; Future - Ambulatory referral to Ophthalmology - aspirin EC 81 MG tablet; Take 1 tablet (81 mg total) by mouth daily. Take after 12 weeks for prevention of preeclampsia later in pregnancy  Dispense: 300 tablet; Refill: 2  2. Multigravida of advanced maternal age in first trimester NIPS done today, will follow up results and manage accordingly.  3. Language barrier, speaks Spanish only Interpreter present  4. Nausea and vomiting in pregnancy Antiemetics ordered - promethazine (PHENERGAN) 25 MG tablet; Take 1 tablet (25 mg total) by mouth every 6 (six) hours as needed for nausea or vomiting.  Dispense: 30 tablet; Refill: 2 - metoCLOPramide (REGLAN) 10 MG tablet; Take 1 tablet (10 mg total) by mouth 4 (four) times daily as needed for nausea or vomiting.  Dispense: 30 tablet; Refill: 2  5. Supervision of high risk pregnancy, antepartum - Flu Vaccine QUAD 36+ mos IM - [place] MFM OB DETAIL +14 WK; Future - Obstetric Panel, Including HIV - [facility] AMB BABYSCRIPTS OPT IN - Genetic Screening - Culture, OB Urine Initial labs drawn. Continue prenatal vitamins. Genetic Screening discussed, NIPS: ordered. Ultrasound discussed; fetal anatomic survey: ordered. Problem list reviewed and updated. The nature of [place] [facility] with multiple MDs and other Advanced Practice Providers was explained to patient; also emphasized  that residents, students are part of our team. Routine obstetric precautions reviewed. Return in about 2 weeks (around 07/12/2018) for OB Visit (HOB).     Jaynie Collins, MD, FACOG Obstetrician & Gynecologist, [facility] [employer], [facility]

## 2018-06-28 NOTE — Patient Instructions (Signed)
Regrese a la clinica cuando tenga su cita. Si tiene problemas o preguntas, llama a la clinica o vaya a la sala de [place].   Diabetes mellitus tipo1 o tipo2 durante el embarazo, cuidados personales Type 1 or Type 2 Diabetes Mellitus During Pregnancy, Self Care El cuidado personal durante el embarazo cuando se tiene diabetes tipo1 (diabetes mellitus tipo1) o diabetes tipo2 (diabetes mellitus tipo2) implica mantener el nivel de azcar en la sangre (glucosa) bajo control a travs del equilibrio de los siguientes factores:  Nutricin.  Actividad fsica.  Cambios en el estilo de vida.  Insulina o medicamentos, si es necesario.  El apoyo del equipo de mdicos y de Producer, television/film/video. La siguiente informacin explica lo que debe saber para mantener la diabetes bajo control en su casa durante el embarazo. Cules son los riesgos? Si la diabetes se trata, hay pocas probabilidades de que ocasione problemas. Si no se la controla con tratamiento, puede causar problemas durante el [place] de parto y [employer], y algunos de esos problemas pueden ser dainos para el beb en gestacin (feto) y Therapist, nutritional. La diabetes que no se controla tambin puede producir problemas respiratorios y glucemia baja en el recin nacido. Tener diabetes puede ponerla en riesgo de sufrir otras afecciones a Barrister's clerk (crnicas), como enfermedad cardaca y enfermedad renal. El mdico puede recetarle medicamentos para evitar las complicaciones causadas por la diabetes. Estos medicamentos pueden incluir:  Aspirina.  Medicamentos para Advertising copywriter.  Medicamentos para Aeronautical engineer presin arterial. Cmo controlar el nivel de glucemia   Contrlese la glucemia todos los [place], con la frecuencia que le haya indicado el mdico.  Comunquese con el mdico si la glucemia est por encima de su valor ideal en 2pruebas consecutivas.  Hgase controlar la A1c (hemoglobinaA1c) al [facility] al ao,  o con la frecuencia que le haya indicado el mdico. El mdico fijar objetivos de tratamiento personales para usted. Generalmente, el objetivo del tratamiento es [employer] siguientes niveles de glucemia durante el embarazo:  Despus de no comer (despus de Patent attorney) durante 8horas: igual o menor que 25m/dl (5,39ml/l).  Despus de las comidas (posprandial): ? Una hora despus de una comida: igual o menor que 14046ml (7,8mm62ml). ? Dos horas despus de una comida: igual o menor que 120mg30m(6,7mmol59m.  Nivel de A1c: del 6% al 6,5%. Cmo controlar la hiperglucemia y la hipoglucemia Sntomas de hiperglucemia La hiperglucemia, tambin denominada glucemia alta, ocurre cuando la glucemia es demasiado alta. Asegrese de conoceRyland Groups tempranos de hiperglucemia, por ejemplo:  Aumento de la sed.  Hambre.  Mucho cansancio.  Necesidad de orinarGarment/textile technologistayor frecuencia que lo habitual.  Visin borrosa. Sntomas de hipoglucemia La hipoglucemia, tambin llamada glucemia baja, ocurre cuando el nivel de glucemia es igual o menor que 70mg/d38m,9mmol/l64mEl riesgo de hipoglucemia aumenta durante o despus de realizar actividad fsica, mientrasHeuvelton durante una enfermedad o si se saltea comidas o ayuna. Es importanPhotographeromas de la hipoglucemia y tratarla de inmediato. Lleve siempre con usted un refrigerio que contenga 15gramos de hidratos de carbono de accin rpida para tratar la glucemia baja. Sus familiares y amigos cercanos tambin deben conocer [employer], y comprender cmo tratar la hipoglucemia, en caso de que usted no pueda tratarse a s mismo. Entre los sntomas se pueden incluir los siguientes:  Hambre. Kidrondad.  Sudoracin y piel hmeda.  Confusin.  Mareos o sensacin de desvanecimiento.  Somnolencia.  Nuseas.  Aumento de  la frecuencia cardaca.  Dolor de [place].  Visin borrosa.  Irritabilidad.  Hormigueo o adormecimiento alrededor  de la boca, labios o lengua.  Cambios en la coordinacin.  Sueo agitado.  Desmayos.  Convulsiones. Tratamiento de la hipoglucemia Si est alerta y puede tragar de Geographical information systems officer segura, siga la regla 15/15, que consiste en lo siguiente:  Consuma 15gramos de hidratos de carbono de accin rpida. Las opciones de accin rpida incluyen: ? 1pomo de glucosa en gel. ? 3comprimidos de glucosa. ? De 6 a 8unidades de caramelos duros. ? 4onzas (141m) de jugo de frutas. ? 4onzas (129m de refresco comn (no diettico).  Contrlese la glucemia 1536mtos despus de ingerir el hidrato de carbono.  Si este nuevo nivel de glucemia todava es igual o menor que 9m67m (3,9mmo11m), ingiera nuevamente 15gramos de un hidrato de carbono.  Si el nivel de glucemia no supera los 9mg/75m3,9mmol/56mdespus de 3intentos, solicite ayuda de inmediato.  Ingiera una comida o una colacin en el transcurso de 1hora despus de que su nivel de glucemia se haya normalizado. Tratamiento de la hipoglucemia grave La hipoglucemia se considera grave cuando su nivel de glucemia es igual o menor que 54mg/dl2mmol/l)57ma hipoglucemia grave es una emergEngineering geologistre hasta que los sntomas desaparezcan. Solicite atencin mdica de inmediato. Comunquese con el servicio de emergencias de su localidad (911 en los [place]). Si tiene hipoglucemia grave y no puede ingerir ningn alimento ni bebida, tal vez deba aplicarse una inyeccin de glucagn. Un familiar o un amigo deben aprender a controlarle el nivel de glucemia y a aplicarle una inyeccin de glucagn. Pregntele al mdico si debera tener disponible un kit de inyecciones de glucagn de emergenciFreight forwarderble que la hipoglucemia grave deba tratarse en un hospital. El tratamiento puede incluir la administracin de glucosa a travs de una va intravenosa. Tambin puede necesitar un tratamiento para tratar la afeccin que est causando la  hipoglucemia. Siga estas indicaciones en su casa: Tome sus medicamentos para la diabetes segn las indicaciones.  Si el mdico le recet insulina o medicamentos para la diabetes, tmelos todos losUS Airwaysquede sin insulina ni sin cualquier otro medicamento para la diabetes que tome. Planifique con antelacin para tenerlos siempre a su disposicin.  Si usa insulCanada, ajuste las dosis de insulina en funcin de la cantidad de actividad fsica que realiza y de los alimentos que consume. El mdico le indicar cmo ajustar su dosis.  El mdico puede recomendarle que tome una aspirina de dosis baja (81mg) dia1mente a fin de ayudar a evitar la presin arterial alta durante el embarazo (preeclampsia o eclampsia). Puede estar en riesgo de padecer preeclampsia o eclampsia si: ? Tuvo cualquiera de los siguientes cuadros durante un embarazo previo:  Preeclampsia o eclampsia.  Un ritmo de crecimiento fetal que fue ms lento que lo normal.  Un parto prematuro.  Separacin de la placenta del tero (desprendimiento de la placenta).  Muerte fetal. ? Est embarazada de ms de un beb. ? Padece otras afecciones, como presin arterial alta o una enfermedad autoinmunitaria. Elija alimentos saludables  Las cosas R.R. Donnelleybe tienen una incidencia en la glucemia y en las dosis de insulina. [place]enas elecciones ayuda a mantener la diabetes bajo control y a evitar otrTax inspector Un plan de alimentacin saludable incluye consumir protenas magras, hidratos de carbono complejos, frutas y verduras frescas, productos lcteos con bajo contenido de grasa y grDjiboutisaludables. Programe una cita con un especialista en  alimentacin y nutricin (nutricionista certificado) para que la ayude a Paediatric nurse un plan de alimentacin adecuado para usted. Asegrese de lo siguiente:  Seguir las indicaciones del mdico respecto de las restricciones de comidas o bebidas.  Beba suficiente lquido como para  Theatre manager la orina de color amarillo plido.  Coma refrigerios saludables entre comidas nutritivas.  Mantener un registro de los hidratos de carbono que consume. Para hacerlo, lea las etiquetas de informacin nutricional y aprenda cules son los tamaos estndares de las porciones de los alimentos.  Seguir Theatre manager para los das de enfermedad cuando no pueda comer ni beber normalmente. [place] este plan por adelantado con el [place] menos 29mnutos de actividad fsica por da, o tanta actividad fsica como le recomiende el mdico durante el eKingv[place]  Si comienza un ejercicio o una actividad nuevos, trabaje con el mdico para ajustar la i[place] los medicamentos o la ingesta de comidas segn sea necesario. Opte por un estilo de vida saludable  No beba alcohol.  No consuma ningn producto que contenga tabaco, lo que incluye cigarrillos, tabaco de mHigher education careers advisery cPsychologist, sport and exercise Si necesita ayuda para dejar de fumar, consulte al mdico.  Aprenda a mEngineer, maintenance (IT) Si necesita ayuda para lograrlo, consulte a su mdico. Cuide su cuerpo  Mantngase al da con las vacunas.  Programe una cita para un examen ocular durante el primer trimestre del embarazo o como se lo haya indicado el mdico.  Examine a diario su piel y pies en busca de cortes, moretones, enrojecimiento, ampollas o llagas. Programe una cita para que el mViacomcontrole los pies una vez por ao.  Lvese los dientes y lCarthageveces por da, y psese hilo dental una o ms veces por dTraining and development officer Visite al dentista una vez cada 665mes o con ms frecuencia.  Mantenga un peso saTax adviserInstrucciones generales  ToDelphie venta libre y los recetados solamente como se lo haya indicado el mdico.  Hable con el mdico sobre el riesgo de tener presin arterial alta durante el embarazo (preeclampsia o eclampsia).  Comparta su plan de control de la diabetes con sus  compaeros de trabajo y de laCytogeneticisty con las personas con las que coShellsburg Contrlese las cetonas urinarias cuando est enferma y coNorth Alam[place] lo haya indicado el mdico.  Lleve una tarjeta de alerta mdica o use un brazalete o medalla de alerta mdica.  Asista a todas las visitas de seguimiento durante el embarazo (prenatales) y despus del parto (posnatales) como se lo haya indicado el mdico. Esto es importante. Preguntas para hacerle al mdico  Es necesario que consulte a unRadio broadcast assistantn el cuidado de la diabetes?  Dnde puedo encontrar un grupo de apoyo para las personas con diabetes? Dnde encontrar ms informacin Para obtener ms informacin sobre la diabetes, visite los siguientes sitios web:  Asociacin Estadounidense de la Diabetes (American Diabetes Association, ADA): www.diabetes.org  Asociacin Estadounidense de Instructores para el Cuidado de la Diabetes (American Association of Diabetes Educators, AADE): www.diabeteseducator.org Resumen  El cuidado personal de las personas que tienen diabetes tipo1 o tipo 2 implica mantener el nivel de azcar en la sangre (glucosa) bajo control. Es posible haNature conservation officerogrando un equilibrio entre la administracin de inWyn[place] otros medicamentos, la nutricin, la actividad fsica y los cambios en el estilo de viNorth Dakota Contrlese la glucemia todos los daKickapoo Tribal Centercon la frecuencia que le haya indicado el mdico.  Si el mdico le  recet insulina o medicamentos para la diabetes, tmelos [employer].  Asista a todas las visitas de seguimiento durante el embarazo (prenatales) y despus del parto (posnatales) como se lo haya indicado el mdico. Esto es importante. Esta informacin no tiene Marine scientist el consejo del mdico. Asegrese de hacerle al mdico cualquier pregunta que tenga. Document Released: 08/09/2015 Document Revised: 02/16/2017 Document Reviewed: 05/21/2015 Elsevier Interactive Patient Education  2019 [employer].

## 2018-06-29 LAB — OBSTETRIC PANEL, INCLUDING HIV
Antibody Screen: NEGATIVE
BASOS: 0 %
Basophils Absolute: 0 10*3/uL (ref 0.0–0.2)
EOS (ABSOLUTE): 0 10*3/uL (ref 0.0–0.4)
Eos: 0 %
HEMATOCRIT: 28.3 % — AB (ref 34.0–46.6)
HEMOGLOBIN: 8.5 g/dL — AB (ref 11.1–15.9)
HIV Screen 4th Generation wRfx: NONREACTIVE
Hepatitis B Surface Ag: NEGATIVE
Immature Grans (Abs): 0.1 10*3/uL (ref 0.0–0.1)
Immature Granulocytes: 1 %
Lymphocytes Absolute: 1 10*3/uL (ref 0.7–3.1)
Lymphs: 10 %
MCH: 19.4 pg — ABNORMAL LOW (ref 26.6–33.0)
MCHC: 30 g/dL — ABNORMAL LOW (ref 31.5–35.7)
MCV: 65 fL — AB (ref 79–97)
Monocytes Absolute: 0.3 10*3/uL (ref 0.1–0.9)
Monocytes: 3 %
Neutrophils Absolute: 8.5 10*3/uL — ABNORMAL HIGH (ref 1.4–7.0)
Neutrophils: 86 %
Platelets: 241 10*3/uL (ref 150–450)
RBC: 4.39 x10E6/uL (ref 3.77–5.28)
RDW: 20.5 % — ABNORMAL HIGH (ref 11.7–15.4)
RPR Ser Ql: NONREACTIVE
RUBELLA: 27.7 {index} (ref >0.99)
Rh Factor: POSITIVE
WBC: 9.9 10*3/uL (ref 3.4–10.8)

## 2018-06-29 LAB — COMPREHENSIVE METABOLIC PANEL
A/G RATIO: 1 — AB (ref 1.2–2.2)
ALBUMIN: 3.5 g/dL — AB (ref 3.8–4.8)
ALT: 18 IU/L (ref 0–32)
AST: 25 IU/L (ref 0–40)
Alkaline Phosphatase: 52 IU/L (ref 39–117)
BUN/Creatinine Ratio: 17 (ref 9–23)
BUN: 9 mg/dL (ref 6–20)
Bilirubin Total: 0.2 mg/dL (ref 0.0–1.2)
CO2: 21 mmol/L (ref 20–29)
CREATININE: 0.53 mg/dL — AB (ref 0.57–1.00)
Calcium: 9.1 mg/dL (ref 8.7–10.2)
Chloride: 104 mmol/L (ref 96–106)
GFR calc Af Amer: 140 mL/min/{1.73_m2} (ref >59)
GFR calc non Af Amer: 122 mL/min/{1.73_m2} (ref >59)
Globulin, Total: 3.4 g/dL (ref 1.5–4.5)
Glucose: 88 mg/dL (ref 65–99)
Potassium: 4.6 mmol/L (ref 3.5–5.2)
Sodium: 136 mmol/L (ref 134–144)
Total Protein: 6.9 g/dL (ref 6.0–8.5)

## 2018-06-29 LAB — PROTEIN / CREATININE RATIO, URINE
Creatinine, Urine: 62.5 mg/dL
Protein, Ur: 9 mg/dL
Protein/Creat Ratio: 144 mg/g creat (ref 0–200)

## 2018-06-29 LAB — HEMOGLOBIN A1C
Est. average glucose Bld gHb Est-mCnc: 137 mg/dL
Hgb A1c MFr Bld: 6.4 % — ABNORMAL HIGH (ref 4.8–5.6)

## 2018-06-29 LAB — TSH: TSH: 0.946 u[IU]/mL (ref 0.450–4.500)

## 2018-06-30 LAB — URINE CULTURE, OB REFLEX

## 2018-06-30 LAB — CULTURE, OB URINE

## 2018-07-11 ENCOUNTER — Encounter: Payer: Medicaid Other | Attending: Obstetrics and Gynecology | Admitting: *Deleted

## 2018-07-11 ENCOUNTER — Ambulatory Visit: Payer: Medicaid Other | Admitting: *Deleted

## 2018-07-11 ENCOUNTER — Other Ambulatory Visit: Payer: Self-pay

## 2018-07-11 DIAGNOSIS — Z713 Dietary counseling and surveillance: Secondary | ICD-10-CM | POA: Insufficient documentation

## 2018-07-11 DIAGNOSIS — O24419 Gestational diabetes mellitus in pregnancy, unspecified control: Secondary | ICD-10-CM | POA: Diagnosis not present

## 2018-07-12 ENCOUNTER — Encounter: Payer: Self-pay | Admitting: Obstetrics and Gynecology

## 2018-07-12 ENCOUNTER — Ambulatory Visit (INDEPENDENT_AMBULATORY_CARE_PROVIDER_SITE_OTHER): Payer: Medicaid Other | Admitting: Obstetrics and Gynecology

## 2018-07-12 VITALS — BP 121/68 | HR 97 | Wt 146.3 lb

## 2018-07-12 DIAGNOSIS — O24011 Pre-existing diabetes mellitus, type 1, in pregnancy, first trimester: Secondary | ICD-10-CM

## 2018-07-12 DIAGNOSIS — O09523 Supervision of elderly multigravida, third trimester: Secondary | ICD-10-CM

## 2018-07-12 DIAGNOSIS — Z3A15 15 weeks gestation of pregnancy: Secondary | ICD-10-CM

## 2018-07-12 DIAGNOSIS — O09522 Supervision of elderly multigravida, second trimester: Secondary | ICD-10-CM

## 2018-07-12 DIAGNOSIS — O99019 Anemia complicating pregnancy, unspecified trimester: Secondary | ICD-10-CM | POA: Insufficient documentation

## 2018-07-12 DIAGNOSIS — O24012 Pre-existing diabetes mellitus, type 1, in pregnancy, second trimester: Secondary | ICD-10-CM

## 2018-07-12 DIAGNOSIS — O24319 Unspecified pre-existing diabetes mellitus in pregnancy, unspecified trimester: Secondary | ICD-10-CM

## 2018-07-12 DIAGNOSIS — Z789 Other specified health status: Secondary | ICD-10-CM

## 2018-07-12 DIAGNOSIS — O24312 Unspecified pre-existing diabetes mellitus in pregnancy, second trimester: Secondary | ICD-10-CM

## 2018-07-12 DIAGNOSIS — O0990 Supervision of high risk pregnancy, unspecified, unspecified trimester: Secondary | ICD-10-CM

## 2018-07-12 DIAGNOSIS — O99012 Anemia complicating pregnancy, second trimester: Secondary | ICD-10-CM

## 2018-07-12 MED ORDER — INSULIN GLARGINE 100 UNIT/ML ~~LOC~~ SOLN: 15.0000 [IU] | Freq: Every day | SUBCUTANEOUS | 11 refills | Status: DC

## 2018-07-12 MED ORDER — FERROUS SULFATE 325 (65 FE) MG PO TABS: 325.0000 mg | ORAL_TABLET | Freq: Two times a day (BID) | ORAL | 3 refills | Status: DC

## 2018-07-12 MED ORDER — INSULIN LISPRO 100 UNIT/ML ~~LOC~~ SOLN: 5.0000 [IU] | Freq: Three times a day (TID) | SUBCUTANEOUS | Status: DC

## 2018-07-12 NOTE — Progress Notes (Signed)
Patient was seen on 07/11/2018 for type 1 Diabetes and pregnancy self-management follow up visit. EDD 01/14/2019. Patient speaks Spanish, we used Stratus interpretor for this  visit.  Patient states history of this diabetes for 13 years.  Diet history obtained. Patient eats good variety of all food groups and beverages include coffee with cream and water.    Her fasting BG are consistently running low. She was on 35 units Lantus but has reduced to 25 units and she is still running in the low 70's and 80's. Her post meal numbers are consistently running 120-150 mg/dl. She is willing to add Humalog for meal times to address this problem.   1) I'd like to suggest decreasing the Lantus to 15 units once a day and  2) Adding Humalog @ 5 units / meal.   Also, I spoke to her about Omnipod and she is interested in pursuing that as well. We would use the Humalog in the Omnipod so that should be an easy transition. I need to get orders signed for that, can't do that tonight, so I will work on that next week.  Plan:   Aim for 3 Carb Choices per meal (45 grams) +/- 1 either way   Continue reading food labels for Total Carbohydrate of foods  Consider  increasing your activity level by walking or other activity daily as tolerated  Continue checking BG before breakfast and 2 hours after first bite of breakfast, lunch and dinner as directed by MD   Bring Log Book/Sheet to every medical appointment    Patient not appropriate for Baby Scripts due to having type 1 diabetes  Take medication as directed by MD  Patient instructed to monitor glucose levels: FBS: 60 - 95 mg/dl 2 hour: <470 mg/dl  Patient received the following handouts:  Omnipod brochure  Patient will be seen for follow-up in 2-4 weeks for BG review and potential Omnipod training.

## 2018-07-12 NOTE — Patient Instructions (Signed)
You can buy Colace (docusate sodium) 100 mg by mouth once daily. If no improvement, take twice daily. This acts as a stool softener and should make it easier to have bowel movements. Please call the office with any questions.  

## 2018-07-12 NOTE — Progress Notes (Signed)
   PRENATAL VISIT NOTE  Subjective:  Lisa Blake is a 38 y.o. G3P2002 at [redacted]w[redacted]d being seen today for ongoing prenatal care.  She is currently monitored for the following issues for this high-risk pregnancy and has Supervision of high risk pregnancy, antepartum; Preexisting diabetes complicating pregnancy, antepartum; AMA (advanced maternal age) multigravida 35+; Language barrier, speaks Spanish only; and Anemia of pregnancy on their problem list.  Patient reports no complaints.  Contractions: Not present. Vag. Bleeding: None.  Movement: Present. Denies leaking of fluid.   The following portions of the patient's history were reviewed and updated as appropriate: allergies, current medications, past family history, past medical history, past social history, past surgical history and problem list.   Objective:   Vitals:   07/12/18 0934  BP: 121/68  Pulse: 97  Weight: 146 lb 4.8 oz (66.4 kg)    Fetal Status: Fetal Heart Rate (bpm): 156   Movement: Present     General:  Alert, oriented and cooperative. Patient is in no acute distress.  Skin: Skin is warm and dry. No rash noted.   Cardiovascular: Normal heart rate noted  Respiratory: Normal respiratory effort, no problems with respiration noted  Abdomen: Soft, gravid, appropriate for gestational age.  Pain/Pressure: Absent     Pelvic: Cervical exam deferred        Extremities: Normal range of motion.  Edema: None  Mental Status: Normal mood and affect. Normal behavior. Normal judgment and thought content.   Assessment and Plan:  Pregnancy: G3P2002 at [redacted]w[redacted]d 1. Supervision of high risk pregnancy, antepartum Anatomy scheduled for 08/20/18  2. Preexisting diabetes complicating pregnancy, antepartum Per note with diabetes counselor from yesterday FG: 70-80s PP: 120-150 Will decrease lantus to 15 units daily and add humalog 5 units per meal per recommendation of RN Script sent to pharmacy  3. Multigravida of advanced  maternal age in third trimester  4. Language barrier, speaks Spanish only Engineer, structural used  5. Anemia during pregnancy in second trimester To start iron BID, reviewed possible need for IV iron transfusion 3rd trimester if not increased enough, she is amenable   Preterm labor symptoms and general obstetric precautions including but not limited to vaginal bleeding, contractions, leaking of fluid and fetal movement were reviewed in detail with the patient. Please refer to After Visit Summary for other counseling recommendations.   Return in about 2 weeks (around 07/26/2018) for OB visit (MD).  Future Appointments  Date Time Provider Department Center  07/30/2018 10:15 AM Noralee Chars [facility] WOC  08/20/2018  3:00 PM [facility] [place] 3 [facility]-M[facility]US [facility]-US    Conan Bowens, MD

## 2018-07-16 ENCOUNTER — Encounter: Payer: Self-pay | Admitting: *Deleted

## 2018-07-16 ENCOUNTER — Other Ambulatory Visit: Payer: Self-pay

## 2018-07-16 MED ORDER — INSULIN LISPRO 100 UNIT/ML ~~LOC~~ SOLN: 5.0000 [IU] | Freq: Three times a day (TID) | SUBCUTANEOUS | 11 refills | Status: DC

## 2018-07-16 NOTE — Progress Notes (Signed)
Original order placed was not received by the pharmacy.  Re-prescribed lispro Humalog insulin 5 units three times daily with meals.

## 2018-07-22 ENCOUNTER — Encounter: Payer: Self-pay | Admitting: *Deleted

## 2018-07-30 ENCOUNTER — Encounter: Payer: Medicaid Other | Admitting: Family Medicine

## 2018-07-30 ENCOUNTER — Other Ambulatory Visit: Payer: Self-pay

## 2018-08-01 ENCOUNTER — Telehealth: Payer: Self-pay | Admitting: Family Medicine

## 2018-08-01 NOTE — Telephone Encounter (Signed)
Called the patient in regards to missed appointment however unable to contact via the contact number in Epic.

## 2018-08-06 ENCOUNTER — Encounter: Payer: Self-pay | Admitting: *Deleted

## 2018-08-20 ENCOUNTER — Ambulatory Visit (HOSPITAL_COMMUNITY): Payer: Medicaid Other | Attending: Obstetrics and Gynecology

## 2018-08-20 ENCOUNTER — Ambulatory Visit (HOSPITAL_COMMUNITY): Payer: Medicaid Other

## 2018-08-27 ENCOUNTER — Other Ambulatory Visit: Payer: Self-pay

## 2018-08-27 MED ORDER — BLOOD PRESSURE MONITORING MISC: 1.0000 | Freq: Every day | 0 refills | Status: DC

## 2018-08-29 ENCOUNTER — Other Ambulatory Visit: Payer: Self-pay | Admitting: General Practice

## 2018-08-29 ENCOUNTER — Encounter: Payer: Self-pay | Admitting: General Practice

## 2018-08-29 DIAGNOSIS — O0992 Supervision of high risk pregnancy, unspecified, second trimester: Secondary | ICD-10-CM

## 2018-08-29 MED ORDER — AMBULATORY NON FORMULARY MEDICATION: 1.0000 | 0 refills | Status: AC

## 2018-08-29 MED ORDER — AMBULATORY NON FORMULARY MEDICATION: 0 refills | Status: DC

## 2018-09-04 ENCOUNTER — Telehealth: Payer: Self-pay | Admitting: General Practice

## 2018-09-04 NOTE — Telephone Encounter (Signed)
Called patient regarding mychart message with Eda for interpreter. Patient states she needs a refill on her insulin and also needs a BP cuff because she feels it's going up and down. Told patient she has refills of insulin at her pharmacy she just needs to notify them. Also discussed a BP cuff has been ordered from a specialty pharmacy and should arrive in the next few days. Patient verbalized understanding. Told patient we need to reschedule her ultrasound appt and office appt. Patient verbalized understanding. Scheduled ultrasound appt for 5/12 & informed patient. Patient verbalized understanding & told her someone from our front office staff would call her with an appt. Patient verbalized understanding & had no questions.

## 2018-09-09 ENCOUNTER — Telehealth: Payer: Self-pay | Admitting: Obstetrics & Gynecology

## 2018-09-09 ENCOUNTER — Telehealth: Payer: Self-pay | Admitting: *Deleted

## 2018-09-09 NOTE — Telephone Encounter (Signed)
Pt's daughter called stating she was returning the clinic call and just wanted to know when the pt's next appointment is.  She requests a call back.

## 2018-09-09 NOTE — Telephone Encounter (Signed)
Called the patient to inform of upcoming visit, left a detailed voicemail. Also sending a reminder letter and instructions on how to download the [employer] webex app.

## 2018-09-10 ENCOUNTER — Other Ambulatory Visit: Payer: Self-pay

## 2018-09-10 ENCOUNTER — Ambulatory Visit (HOSPITAL_COMMUNITY): Payer: Medicaid Other | Admitting: *Deleted

## 2018-09-10 ENCOUNTER — Ambulatory Visit (HOSPITAL_COMMUNITY)
Admission: RE | Admit: 2018-09-10 | Discharge: 2018-09-10 | Disposition: A | Discharge status: Home / Self Care | Payer: Medicaid Other | Source: Ambulatory Visit | Attending: Obstetrics and Gynecology | Admitting: Obstetrics and Gynecology

## 2018-09-10 ENCOUNTER — Encounter (HOSPITAL_COMMUNITY): Payer: Self-pay

## 2018-09-10 DIAGNOSIS — O99012 Anemia complicating pregnancy, second trimester: Secondary | ICD-10-CM

## 2018-09-10 DIAGNOSIS — O24112 Pre-existing diabetes mellitus, type 2, in pregnancy, second trimester: Secondary | ICD-10-CM

## 2018-09-10 DIAGNOSIS — O09522 Supervision of elderly multigravida, second trimester: Secondary | ICD-10-CM | POA: Diagnosis not present

## 2018-09-10 DIAGNOSIS — Z862 Personal history of diseases of the blood and blood-forming organs and certain disorders involving the immune mechanism: Secondary | ICD-10-CM

## 2018-09-10 DIAGNOSIS — O0990 Supervision of high risk pregnancy, unspecified, unspecified trimester: Secondary | ICD-10-CM | POA: Insufficient documentation

## 2018-09-10 DIAGNOSIS — O34219 Maternal care for unspecified type scar from previous cesarean delivery: Secondary | ICD-10-CM | POA: Diagnosis not present

## 2018-09-10 DIAGNOSIS — Z3A22 22 weeks gestation of pregnancy: Secondary | ICD-10-CM

## 2018-09-10 DIAGNOSIS — Z363 Encounter for antenatal screening for malformations: Secondary | ICD-10-CM

## 2018-09-10 NOTE — Telephone Encounter (Signed)
Interpreter # [phone] used to return pt's call regarding her appointment.  Pt did not pick up.  Left message advising pt that she has an appointment with MFM today 5/12 @ 0800 and her next appointment with out clinic is 6/1.

## 2018-09-25 ENCOUNTER — Encounter: Payer: Medicaid Other | Admitting: Obstetrics & Gynecology

## 2018-09-30 ENCOUNTER — Other Ambulatory Visit: Payer: Self-pay

## 2018-09-30 ENCOUNTER — Encounter: Payer: Medicaid Other | Admitting: Obstetrics & Gynecology

## 2018-09-30 ENCOUNTER — Ambulatory Visit (INDEPENDENT_AMBULATORY_CARE_PROVIDER_SITE_OTHER): Payer: Medicaid Other | Admitting: Obstetrics and Gynecology

## 2018-09-30 VITALS — BP 117/80

## 2018-09-30 DIAGNOSIS — Z98891 History of uterine scar from previous surgery: Secondary | ICD-10-CM

## 2018-09-30 DIAGNOSIS — O09522 Supervision of elderly multigravida, second trimester: Secondary | ICD-10-CM

## 2018-09-30 DIAGNOSIS — O0990 Supervision of high risk pregnancy, unspecified, unspecified trimester: Secondary | ICD-10-CM

## 2018-09-30 DIAGNOSIS — Z3A26 26 weeks gestation of pregnancy: Secondary | ICD-10-CM

## 2018-09-30 DIAGNOSIS — O24312 Unspecified pre-existing diabetes mellitus in pregnancy, second trimester: Secondary | ICD-10-CM

## 2018-09-30 DIAGNOSIS — Z789 Other specified health status: Secondary | ICD-10-CM

## 2018-09-30 DIAGNOSIS — O09529 Supervision of elderly multigravida, unspecified trimester: Secondary | ICD-10-CM

## 2018-09-30 DIAGNOSIS — O24319 Unspecified pre-existing diabetes mellitus in pregnancy, unspecified trimester: Secondary | ICD-10-CM

## 2018-09-30 MED ORDER — PREPLUS 27-1 MG PO TABS: 1.0000 | ORAL_TABLET | Freq: Every day | ORAL | 5 refills | Status: DC

## 2018-09-30 NOTE — Progress Notes (Addendum)
   TELEHEALTH VIRTUAL OBSTETRICS VISIT ENCOUNTER NOTE  I connected with Lisa Blake on 09/30/18 at  3:55 PM EDT by telephone at home and verified that I am speaking with the correct person using two identifiers.   I discussed the limitations, risks, security and privacy concerns of performing an evaluation and management service by telephone and the availability of in person appointments. I also discussed with the patient that there may be a patient responsible charge related to this service. The patient expressed understanding and agreed to proceed.  Subjective:  Lisa Blake is a 38 y.o. G3P2002 at [redacted]w[redacted]d being followed for ongoing prenatal care.  She is currently monitored for the following issues for this high-risk pregnancy and has Supervision of high risk pregnancy, antepartum; Preexisting diabetes complicating pregnancy, antepartum; AMA (advanced maternal age) multigravida 35+; Language barrier, speaks Spanish only; Anemia of pregnancy; and History of cesarean delivery on their problem list.  Patient reports no complaints. Reports fetal movement. Denies any contractions, bleeding or leaking of fluid.   The following portions of the patient's history were reviewed and updated as appropriate: allergies, current medications, past family history, past medical history, past social history, past surgical history and problem list.   Objective:   Vitals:   09/30/18 1532  BP: 117/80    Babyscripts Data Reviewed: not applicable  General:  Alert, oriented and cooperative.   Mental Status: Normal mood and affect perceived. Normal judgment and thought content.  Rest of physical exam deferred due to type of encounter  Assessment and Plan:  Pregnancy: G3P2002 at [redacted]w[redacted]d 1. Preexisting diabetes complicating pregnancy, antepartum Patient only checking CBGs q3d b/c she said her CBGs were always well controlled outside of pregnancy. I told her that pregnancy hormones can  make your CBGs go up. Pt to check qid (am fasting and 2h PP) range to shoot for d/w her. Patient currently on  Lantus 15 qhs Aspart 5 w/ food  She states am fasting aroud 70s and 2h pp less than 120 when she does check.  Follow up growth nv. Start ap testing at 32wks  2. Antepartum multigravida of advanced maternal age No issues  3. Supervision of high risk pregnancy, antepartum 28wk labs and u/s nv  4. Language barrier, speaks Spanish only Interpreter used  5. H/o c-section Ask about btl nv and sign papers PRN   Preterm labor symptoms and general obstetric precautions including but not limited to vaginal bleeding, contractions, leaking of fluid and fetal movement were reviewed in detail with the patient.  I discussed the assessment and treatment plan with the patient. The patient was provided an opportunity to ask questions and all were answered. The patient agreed with the plan and demonstrated an understanding of the instructions. The patient was advised to call back or seek an in-person office evaluation/go to [facility]U at [facility] for any urgent or concerning symptoms. Please refer to After Visit Summary for other counseling recommendations.   I provided 15 minutes of non-face-to-face time during this encounter. The visit was conducted via Webex-medicine  Return in about 2 weeks (around 10/14/2018) for needs mfm u/s and hrob visit in person.  No future appointments.  Queen Anne Bing, MD [facility] [employer], [facility]

## 2018-09-30 NOTE — Progress Notes (Signed)
I connected with  Lisa Blake on 09/30/18 at  3:55 PM EDT by telephone and verified that I am speaking with the correct person using two identifiers.   I discussed the limitations, risks, security and privacy concerns of performing an evaluation and management service by telephone and virtually and the availability of in person appointments. I also discussed with the patient that there may be a patient responsible charge related to this service. The patient expressed understanding and agreed to proceed.  States she is not home and can't take her blood pressure today but her bp was 117/80 yesterday  Darnesha Diloreto,RN 09/30/2018  3:27 PM

## 2018-10-09 ENCOUNTER — Telehealth: Payer: Self-pay | Admitting: Family Medicine

## 2018-10-09 NOTE — Telephone Encounter (Signed)
Patient was called and informed about appt by Eda.

## 2018-10-14 ENCOUNTER — Other Ambulatory Visit: Payer: Self-pay

## 2018-10-14 ENCOUNTER — Ambulatory Visit (INDEPENDENT_AMBULATORY_CARE_PROVIDER_SITE_OTHER): Payer: Medicaid Other | Admitting: Obstetrics and Gynecology

## 2018-10-14 VITALS — BP 118/81 | HR 95 | Wt 155.0 lb

## 2018-10-14 DIAGNOSIS — O24011 Pre-existing diabetes mellitus, type 1, in pregnancy, first trimester: Secondary | ICD-10-CM

## 2018-10-14 DIAGNOSIS — O24013 Pre-existing diabetes mellitus, type 1, in pregnancy, third trimester: Secondary | ICD-10-CM

## 2018-10-14 DIAGNOSIS — Z3A28 28 weeks gestation of pregnancy: Secondary | ICD-10-CM | POA: Diagnosis not present

## 2018-10-14 DIAGNOSIS — O0990 Supervision of high risk pregnancy, unspecified, unspecified trimester: Secondary | ICD-10-CM

## 2018-10-14 DIAGNOSIS — Z3009 Encounter for other general counseling and advice on contraception: Secondary | ICD-10-CM | POA: Diagnosis not present

## 2018-10-14 DIAGNOSIS — Z23 Encounter for immunization: Secondary | ICD-10-CM

## 2018-10-14 DIAGNOSIS — O24319 Unspecified pre-existing diabetes mellitus in pregnancy, unspecified trimester: Secondary | ICD-10-CM

## 2018-10-14 DIAGNOSIS — O24313 Unspecified pre-existing diabetes mellitus in pregnancy, third trimester: Secondary | ICD-10-CM

## 2018-10-14 DIAGNOSIS — Z98891 History of uterine scar from previous surgery: Secondary | ICD-10-CM | POA: Diagnosis not present

## 2018-10-14 MED ORDER — "INSULIN SYRINGE 30G X 5/16"" 0.5 ML MISC": 1.0000 | 5 refills | Status: DC

## 2018-10-14 MED ORDER — INSULIN GLARGINE 100 UNIT/ML ~~LOC~~ SOLN: 15.0000 [IU] | Freq: Every day | SUBCUTANEOUS | 11 refills | Status: DC

## 2018-10-14 NOTE — Progress Notes (Signed)
Spanish Intepreter [place].  Tdap vaccine given BTL paperwork signed

## 2018-10-14 NOTE — Progress Notes (Signed)
Subjective:  Lisa Blake is a 38 y.o. G3P2002 at [redacted]w[redacted]d being seen today for ongoing prenatal care.  She is currently monitored for the following issues for this high-risk pregnancy and has Supervision of high risk pregnancy, antepartum; Preexisting diabetes complicating pregnancy, antepartum; AMA (advanced maternal age) multigravida 35+; Language barrier, speaks Spanish only; Anemia of pregnancy; History of cesarean delivery; and Unwanted fertility on their problem list.  Patient reports no complaints.  Contractions: Not present. Vag. Bleeding: None.  Movement: Present. Denies leaking of fluid.   The following portions of the patient's history were reviewed and updated as appropriate: allergies, current medications, past family history, past medical history, past social history, past surgical history and problem list. Problem list updated.  Objective:   Vitals:   10/14/18 0948  BP: 118/81  Pulse: 95  Weight: 155 lb (70.3 kg)    Fetal Status: Fetal Heart Rate (bpm): 150   Movement: Present     General:  Alert, oriented and cooperative. Patient is in no acute distress.  Skin: Skin is warm and dry. No rash noted.   Cardiovascular: Normal heart rate noted  Respiratory: Normal respiratory effort, no problems with respiration noted  Abdomen: Soft, gravid, appropriate for gestational age. Pain/Pressure: Present     Pelvic:  Cervical exam deferred        Extremities: Normal range of motion.  Edema: None  Mental Status: Normal mood and affect. Normal behavior. Normal judgment and thought content.   Urinalysis:      Assessment and Plan:  Pregnancy: G3P2002 at [redacted]w[redacted]d  1. Supervision of high risk pregnancy, antepartum Stable - CBC - HIV Antibody (routine testing w rflx) - RPR  2. Preexisting diabetes complicating pregnancy, antepartum Has not been taking meal insulin on a regular bases. Importance of taking her insulin as prescribed reviewed with pt CBG log provided to pt  to record  3. History of cesarean delivery For repeat with BTL  4. Unwanted fertility BTL papers signed today  Preterm labor symptoms and general obstetric precautions including but not limited to vaginal bleeding, contractions, leaking of fluid and fetal movement were reviewed in detail with the patient. Please refer to After Visit Summary for other counseling recommendations.  Return in about 2 weeks (around 10/28/2018) for OB visit, face to face.   Chancy Milroy, MD

## 2018-10-15 LAB — CBC
Hematocrit: 32.7 % — ABNORMAL LOW (ref 34.0–46.6)
Hemoglobin: 10.1 g/dL — ABNORMAL LOW (ref 11.1–15.9)
MCH: 20.9 pg — ABNORMAL LOW (ref 26.6–33.0)
MCHC: 30.9 g/dL — ABNORMAL LOW (ref 31.5–35.7)
MCV: 68 fL — ABNORMAL LOW (ref 79–97)
Platelets: 201 10*3/uL (ref 150–450)
RBC: 4.83 x10E6/uL (ref 3.77–5.28)
RDW: 21 % — ABNORMAL HIGH (ref 11.7–15.4)
WBC: 6.9 10*3/uL (ref 3.4–10.8)

## 2018-10-15 LAB — HIV ANTIBODY (ROUTINE TESTING W REFLEX): HIV Screen 4th Generation wRfx: NONREACTIVE

## 2018-10-15 LAB — RPR: RPR Ser Ql: NONREACTIVE

## 2018-10-30 ENCOUNTER — Ambulatory Visit (INDEPENDENT_AMBULATORY_CARE_PROVIDER_SITE_OTHER): Payer: Medicaid Other | Admitting: Obstetrics and Gynecology

## 2018-10-30 ENCOUNTER — Encounter: Payer: Self-pay | Admitting: Obstetrics and Gynecology

## 2018-10-30 ENCOUNTER — Encounter: Payer: Self-pay | Admitting: *Deleted

## 2018-10-30 ENCOUNTER — Other Ambulatory Visit: Payer: Self-pay

## 2018-10-30 VITALS — BP 128/87 | HR 103 | Wt 156.0 lb

## 2018-10-30 DIAGNOSIS — O09523 Supervision of elderly multigravida, third trimester: Secondary | ICD-10-CM

## 2018-10-30 DIAGNOSIS — Z3A31 31 weeks gestation of pregnancy: Secondary | ICD-10-CM

## 2018-10-30 DIAGNOSIS — O24319 Unspecified pre-existing diabetes mellitus in pregnancy, unspecified trimester: Secondary | ICD-10-CM

## 2018-10-30 DIAGNOSIS — Z789 Other specified health status: Secondary | ICD-10-CM

## 2018-10-30 DIAGNOSIS — O0993 Supervision of high risk pregnancy, unspecified, third trimester: Secondary | ICD-10-CM

## 2018-10-30 DIAGNOSIS — O24313 Unspecified pre-existing diabetes mellitus in pregnancy, third trimester: Secondary | ICD-10-CM

## 2018-10-30 DIAGNOSIS — Z3009 Encounter for other general counseling and advice on contraception: Secondary | ICD-10-CM

## 2018-10-30 DIAGNOSIS — O0990 Supervision of high risk pregnancy, unspecified, unspecified trimester: Secondary | ICD-10-CM

## 2018-10-30 DIAGNOSIS — Z98891 History of uterine scar from previous surgery: Secondary | ICD-10-CM

## 2018-10-30 NOTE — Progress Notes (Signed)
Pt reports headache with blurred vision and spots in her vision since last night that has not gone away with tylenol.

## 2018-10-30 NOTE — Progress Notes (Signed)
Subjective:  Lisa Blake is a 38 y.o. G3P2002 at [redacted]w[redacted]d being seen today for ongoing prenatal care.  She is currently monitored for the following issues for this high-risk pregnancy and has Supervision of high risk pregnancy, antepartum; Preexisting diabetes complicating pregnancy, antepartum; AMA (advanced maternal age) multigravida 35+; Language barrier, speaks Spanish only; Anemia of pregnancy; History of cesarean delivery; and Unwanted fertility on their problem list.  Patient reports HA and visual changes for the last 2 days.  Contractions: Not present. Vag. Bleeding: None.  Movement: Present. Denies leaking of fluid.   The following portions of the patient's history were reviewed and updated as appropriate: allergies, current medications, past family history, past medical history, past social history, past surgical history and problem list. Problem list updated.  Objective:   Vitals:   10/30/18 1519 10/30/18 1523  BP: (!) 137/94 128/87  Pulse: (!) 103 (!) 103  Weight: 156 lb (70.8 kg)     Fetal Status: Fetal Heart Rate (bpm): 164   Movement: Present     General:  Alert, oriented and cooperative. Patient is in no acute distress.  Skin: Skin is warm and dry. No rash noted.   Cardiovascular: Normal heart rate noted  Respiratory: Normal respiratory effort, no problems with respiration noted  Abdomen: Soft, gravid, appropriate for gestational age. Pain/Pressure: Present     Pelvic:  Cervical exam deferred        Extremities: Normal range of motion.  Edema: None  Mental Status: Normal mood and affect. Normal behavior. Normal judgment and thought content.   Urinalysis:      Assessment and Plan:  Pregnancy: G3P2002 at [redacted]w[redacted]d  1. Supervision of high risk pregnancy, antepartum To MAU for further eval  2. Preexisting diabetes complicating pregnancy, antepartum Not controlled To MAU Has not had ECHO yet as well Will work to schedule   3. Multigravida of advanced  maternal age in third trimester LR NIPS  4. History of cesarean delivery For repeat  5. Unwanted fertility BTL papers signed  6. Language barrier, speaks Spanish only Live interrupter used today  Preterm labor symptoms and general obstetric precautions including but not limited to vaginal bleeding, contractions, leaking of fluid and fetal movement were reviewed in detail with the patient. Please refer to After Visit Summary for other counseling recommendations.  Return in about 1 week (around 11/06/2018) for OB visit, face to face.   Chancy Milroy, MD

## 2018-10-30 NOTE — Progress Notes (Signed)
Fetal echo scheduled for Monday 11/04/18 @ 0800.  Pt given the address:  [address].

## 2018-11-11 ENCOUNTER — Other Ambulatory Visit: Payer: Self-pay

## 2018-11-11 ENCOUNTER — Encounter: Payer: Self-pay | Admitting: Obstetrics & Gynecology

## 2018-11-11 ENCOUNTER — Ambulatory Visit (INDEPENDENT_AMBULATORY_CARE_PROVIDER_SITE_OTHER): Payer: Medicaid Other | Admitting: Obstetrics & Gynecology

## 2018-11-11 DIAGNOSIS — O24013 Pre-existing diabetes mellitus, type 1, in pregnancy, third trimester: Secondary | ICD-10-CM

## 2018-11-11 DIAGNOSIS — O24011 Pre-existing diabetes mellitus, type 1, in pregnancy, first trimester: Secondary | ICD-10-CM

## 2018-11-11 DIAGNOSIS — Z3A32 32 weeks gestation of pregnancy: Secondary | ICD-10-CM

## 2018-11-11 MED ORDER — INSULIN GLARGINE 100 UNIT/ML ~~LOC~~ SOLN: 20.0000 [IU] | Freq: Every day | SUBCUTANEOUS | 11 refills | Status: DC

## 2018-11-11 MED ORDER — PANTOPRAZOLE SODIUM 20 MG PO TBEC: 20.0000 mg | DELAYED_RELEASE_TABLET | Freq: Every day | ORAL | 1 refills | Status: DC

## 2018-11-11 MED ORDER — INSULIN LISPRO 100 UNIT/ML ~~LOC~~ SOLN: 6.0000 [IU] | Freq: Three times a day (TID) | SUBCUTANEOUS | 11 refills | Status: DC

## 2018-11-11 MED ORDER — DOCUSATE SODIUM 100 MG PO CAPS: 100.0000 mg | ORAL_CAPSULE | Freq: Two times a day (BID) | ORAL | 2 refills | Status: DC | PRN

## 2018-11-11 NOTE — Patient Instructions (Signed)
Parto por cesrea Cesarean Delivery El nacimiento o parto por cesrea es el parto quirrgico de un beb a travs de una incisin en el abdomen y el tero. Se lo conoce como cesrea. Este procedimiento se puede programar con anticipacin o se puede realizar en una situacin de emergencia. Informe al mdico acerca de lo siguiente:  Cualquier alergia que tenga.  Todos los medicamentos que utiliza, incluidos vitaminas, hierbas, gotas oftlmicas, cremas y medicamentos de venta libre.  Cualquier problema previo que usted o algn miembro de su familia haya tenido con los anestsicos.  Cualquier trastorno de la sangre que tenga.  Cirugas a las que se haya sometido.  Cualquier afeccin mdica que tenga.  Si usted o algn familiar tiene antecedentes de trombosis venosa profunda (TVP) o embolia pulmonar (EP). Cules son los riesgos? En general, se trata de un procedimiento seguro. Sin embargo, pueden ocurrir complicaciones, por ejemplo:  Infeccin.  Sangrado.  Reacciones alrgicas a los medicamentos.  Daos a otras estructuras u rganos.  Cogulos de sangre.  Lesiones al beb. Qu ocurre antes del procedimiento? Indicaciones generales  Siga las indicaciones del mdico respecto de las restricciones en las comidas o las bebidas.  Si sabe que va a tener un parto por cesrea, no se rasure la zona pbica. Rasurarse antes del procedimiento puede aumentar el riesgo de infeccin.  Pdale a alguien que la lleve a su casa desde el hospital.  Pregntele al mdico qu medidas se tomarn para prevenir una infeccin. Estas pueden incluir: ? Rasurar el vello del lugar de la ciruga. ? Lavar la piel con un jabn antisptico. ? Suministrar antibiticos.  Segn el motivo del parto por cesrea, es posible que se le realice un examen fsico o una prueba adicional, como una ecografa.  Posiblemente le realicen un anlisis de sangre u orina. Preguntas para hacerle al mdico  Consulte al mdico  sobre: ? Cambiar o suspender los medicamentos que toma habitualmente. Esto es muy importante si toma medicamentos para la diabetes o anticoagulantes. ? El plan para controlar el dolor. Esto es muy importante si piensa amamantar a su beb. ? Cunto tiempo permanecer en el hospital despus del procedimiento. ? Cualquier inquietud que pueda tener sobre recibir hemoderivados, si los necesita durante el procedimiento. ? Almacenamiento de sangre del cordn, si planea guardar la sangre del cordn umbilical del beb.  Quiz tambin desee preguntarle al mdico lo siguiente: ? Si va a poder sostener o amamantar a su beb mientras an se encuentre en el quirfano. ? Si su beb puede quedarse con usted inmediatamente despus del procedimiento y durante su recuperacin. ? Si un familiar o la persona que usted elija puede ingresar al quirfano y permanecer con usted durante el procedimiento, inmediatamente despus de este y durante su recuperacin. Qu ocurre durante el procedimiento?   Le colocarn una va intravenosa en una vena.  Le administrarn lquido y medicamentos, tales como antibiticos, antes de la ciruga.  Le colocarn monitores fetales en el abdomen para controlar la frecuencia cardaca de su beb.  Se le puede entregar una bata especial de calentamiento para mantener estable la temperatura corporal.  Se le puede insertar un catter en la vejiga a travs de la uretra. Esto se hace para drenar la orina durante el procedimiento.  Pueden administrarle uno o ms de los siguientes medicamentos: ? Un medicamento para adormecer la zona (anestesia local). ? Un medicamento que la har dormir (anestesia general). ? Un medicamento (anestesia regional) que se le inyecta en la espalda o a   travs de un tubo pequeo y delgado que se le coloca en la espalda (anestesia raqudea o anestesia epidural). Esto adormece la parte del cuerpo que est por debajo del sitio de la inyeccin y le permite permanecer  despierta durante el procedimiento. Si llega a sentir nuseas, dgaselo al mdico. Tendr medicamentos disponibles para ayudarla a reducir cualquier nusea que pueda sentir.  Le harn una incisin en el abdomen y luego en el tero.  Si est despierta durante el procedimiento, puede sentir tirones en el abdomen, pero no debera sentir dolor. Si siente dolor, dgaselo al mdico inmediatamente.  Se sacar al beb del tero. Es posible que sienta ms presin o un tirn, mientras esto sucede.  Inmediatamente despus del nacimiento, se secar al beb y se lo mantendr caliente. Podr sostener y amamantar a su beb.  Durante este momento, es posible que se pince y corte el cordn umbilical. Esto ocurre por lo general despus de un perodo de 1 a 2 minutos despus del parto.  Se le extraer la placenta del tero.  Las incisiones se cerrarn con puntos (suturas). Es posible que se apliquen grapas, goma para cerrar la piel o tiras adhesivas en la incisin del abdomen.  Pueden colocarle vendas (vendajes) sobre la incisin del abdomen. El procedimiento puede variar segn el mdico y el hospital. Qu ocurre despus del procedimiento?  Le controlarn la presin arterial, la frecuencia cardaca, la frecuencia respiratoria y el nivel de oxgeno en la sangre hasta que le den el alta del hospital.  Puede seguir recibiendo lquidos o medicamentos por va intravenosa.  Sentir un poco de dolor. Tendr analgsicos disponibles para ayudarla a controlar el dolor.  Para evitar la formacin de cogulos de sangre: ? Se le pueden administrar medicamentos. ? Quizs deba usar medias o dispositivos de compresin. ? Se le indicar que camine cuando pueda hacerlo.  El personal del hospital alentar y apoyar que cree lazos con su beb. En el hospital, es posible que le permitan que el beb permanezca en la misma habitacin que usted (internacin conjunta) durante la hospitalizacin para fomentar la creacin del  vnculo y un amamantamiento exitoso.  Se le puede sugerir que tosa y respire profundamente con frecuencia. Esto ayuda a evitar problemas pulmonares.  Si tiene un catter que le drena la orina, se le quitar lo antes posible despus del procedimiento. Resumen  El nacimiento o parto por cesrea es el parto quirrgico de un beb a travs de una incisin en el abdomen y el tero.  Siga las indicaciones del mdico con respecto a las restricciones de comidas o bebidas antes del procedimiento.  Sentir algo de dolor despus del procedimiento. Tendr analgsicos disponibles para ayudarla a controlar el dolor.  El personal del hospital alentar y apoyar que cree lazos con su beb despus del procedimiento. En el hospital, es posible que le permitan que el beb permanezca en la misma habitacin que usted (internacin conjunta) durante la hospitalizacin para fomentar la creacin del vnculo y un amamantamiento exitoso. Esta informacin no tiene como fin reemplazar el consejo del mdico. Asegrese de hacerle al mdico cualquier pregunta que tenga. Document Released: 04/17/2005 Document Revised: 11/29/2017 Document Reviewed: 11/29/2017 Elsevier Patient Education  2020 Elsevier Inc.  

## 2018-11-11 NOTE — Progress Notes (Signed)
   PRENATAL VISIT NOTE  Subjective:  Lisa Blake is a 38 y.o. G3P2002 at [redacted]w[redacted]d being seen today for ongoing prenatal care.  She is currently monitored for the following issues for this high-risk pregnancy and has Supervision of high risk pregnancy, antepartum; Preexisting diabetes complicating pregnancy, antepartum; AMA (advanced maternal age) multigravida 35+; Language barrier, speaks Spanish only; Anemia of pregnancy; History of cesarean delivery; and Unwanted fertility on their problem list.  Patient reports heartburn and constipation.  Contractions: Not present. Vag. Bleeding: None.  Movement: Present. Denies leaking of fluid.   The following portions of the patient's history were reviewed and updated as appropriate: allergies, current medications, past family history, past medical history, past social history, past surgical history and problem list.   Objective:   Vitals:   11/11/18 1537  BP: 134/85  Pulse: 96  Temp: 97.8 F (36.6 C)  Weight: 160 lb (72.6 kg)    Fetal Status: Fetal Heart Rate (bpm): 159   Movement: Present     General:  Alert, oriented and cooperative. Patient is in no acute distress.  Skin: Skin is warm and dry. No rash noted.   Cardiovascular: Normal heart rate noted  Respiratory: Normal respiratory effort, no problems with respiration noted  Abdomen: Soft, gravid, appropriate for gestational age.  Pain/Pressure: Present     Pelvic: Cervical exam deferred        Extremities: Normal range of motion.  Edema: None  Mental Status: Normal mood and affect. Normal behavior. Normal judgment and thought content.   Assessment and Plan:  Pregnancy: G3P2002 at [redacted]w[redacted]d 1. Pre-existing type 1 diabetes mellitus during pregnancy in first trimester States FBS <90 with increased Lantus, PP 130-150  - insulin glargine (LANTUS) 100 UNIT/ML injection; Inject 0.2 mLs (20 Units total) into the skin daily.  Dispense: 10 mL; Refill: 11 - insulin lispro (HUMALOG) 100  UNIT/ML injection; Inject 0.06 mLs (6 Units total) into the skin 3 (three) times daily with meals.  Dispense: 10 mL; Refill: 11 - docusate sodium (COLACE) 100 MG capsule; Take 1 capsule (100 mg total) by mouth 2 (two) times daily as needed.  Dispense: 30 capsule; Refill: 2 - pantoprazole (PROTONIX) 20 MG tablet; Take 1 tablet (20 mg total) by mouth daily.  Dispense: 30 tablet; Refill: 1 - [place] MFM OB FOLLOW UP; Future  Preterm labor symptoms and general obstetric precautions including but not limited to vaginal bleeding, contractions, leaking of fluid and fetal movement were reviewed in detail with the patient. Please refer to After Visit Summary for other counseling recommendations.   Return in about 1 week (around 11/18/2018) for needs BPP and NST every week start ASAP.  Future Appointments  Date Time Provider [place]  11/15/2018  3:15 PM [place] NURSE [place] [facility]  11/15/2018  3:15 PM [facility] [place] 4 [facility]    Emeterio Reeve, MD

## 2018-11-15 ENCOUNTER — Encounter (HOSPITAL_COMMUNITY): Payer: Self-pay

## 2018-11-15 ENCOUNTER — Ambulatory Visit (HOSPITAL_COMMUNITY)
Admission: RE | Admit: 2018-11-15 | Discharge: 2018-11-15 | Disposition: A | Discharge status: Home / Self Care | Payer: Medicaid Other | Source: Ambulatory Visit | Attending: Obstetrics & Gynecology | Admitting: Obstetrics & Gynecology

## 2018-11-15 ENCOUNTER — Ambulatory Visit (HOSPITAL_COMMUNITY): Payer: Medicaid Other | Admitting: *Deleted

## 2018-11-15 ENCOUNTER — Other Ambulatory Visit: Payer: Self-pay

## 2018-11-15 DIAGNOSIS — O0990 Supervision of high risk pregnancy, unspecified, unspecified trimester: Secondary | ICD-10-CM | POA: Insufficient documentation

## 2018-11-15 DIAGNOSIS — O99013 Anemia complicating pregnancy, third trimester: Secondary | ICD-10-CM

## 2018-11-15 DIAGNOSIS — O09523 Supervision of elderly multigravida, third trimester: Secondary | ICD-10-CM | POA: Diagnosis not present

## 2018-11-15 DIAGNOSIS — O24113 Pre-existing diabetes mellitus, type 2, in pregnancy, third trimester: Secondary | ICD-10-CM | POA: Diagnosis not present

## 2018-11-15 DIAGNOSIS — Z862 Personal history of diseases of the blood and blood-forming organs and certain disorders involving the immune mechanism: Secondary | ICD-10-CM | POA: Diagnosis not present

## 2018-11-15 DIAGNOSIS — O24011 Pre-existing diabetes mellitus, type 1, in pregnancy, first trimester: Secondary | ICD-10-CM

## 2018-11-15 DIAGNOSIS — O34219 Maternal care for unspecified type scar from previous cesarean delivery: Secondary | ICD-10-CM

## 2018-11-15 DIAGNOSIS — Z3A33 33 weeks gestation of pregnancy: Secondary | ICD-10-CM

## 2018-11-18 ENCOUNTER — Other Ambulatory Visit: Payer: Self-pay

## 2018-11-18 ENCOUNTER — Ambulatory Visit: Payer: Self-pay

## 2018-11-18 ENCOUNTER — Other Ambulatory Visit (HOSPITAL_COMMUNITY): Payer: Self-pay | Admitting: *Deleted

## 2018-11-18 ENCOUNTER — Ambulatory Visit (INDEPENDENT_AMBULATORY_CARE_PROVIDER_SITE_OTHER): Payer: Medicaid Other | Admitting: *Deleted

## 2018-11-18 VITALS — BP 133/88 | HR 89 | Wt 162.4 lb

## 2018-11-18 DIAGNOSIS — O24319 Unspecified pre-existing diabetes mellitus in pregnancy, unspecified trimester: Secondary | ICD-10-CM

## 2018-11-18 DIAGNOSIS — O24119 Pre-existing diabetes mellitus, type 2, in pregnancy, unspecified trimester: Secondary | ICD-10-CM

## 2018-11-18 DIAGNOSIS — O24313 Unspecified pre-existing diabetes mellitus in pregnancy, third trimester: Secondary | ICD-10-CM | POA: Diagnosis not present

## 2018-11-18 DIAGNOSIS — Z3A33 33 weeks gestation of pregnancy: Secondary | ICD-10-CM

## 2018-11-18 NOTE — Progress Notes (Signed)
Interpreter Raquel Mora present for encounter.  Pt informed that the ultrasound is considered a limited OB ultrasound and is not intended to be a complete ultrasound exam.  Patient also informed that the ultrasound is not being completed with the intent of assessing for fetal or placental anomalies or any pelvic abnormalities.  Explained that the purpose of today's ultrasound is to assess for presentation, BPP and amniotic fluid volume.  Patient acknowledges the purpose of the exam and the limitations of the study.    

## 2018-11-19 ENCOUNTER — Other Ambulatory Visit: Payer: Self-pay | Admitting: Family Medicine

## 2018-11-25 ENCOUNTER — Ambulatory Visit: Payer: Self-pay

## 2018-11-25 ENCOUNTER — Ambulatory Visit (INDEPENDENT_AMBULATORY_CARE_PROVIDER_SITE_OTHER): Payer: Medicaid Other | Admitting: *Deleted

## 2018-11-25 ENCOUNTER — Other Ambulatory Visit: Payer: Self-pay

## 2018-11-25 ENCOUNTER — Telehealth (INDEPENDENT_AMBULATORY_CARE_PROVIDER_SITE_OTHER): Payer: Medicaid Other | Admitting: Obstetrics & Gynecology

## 2018-11-25 VITALS — BP 132/87 | HR 81 | Temp 98.3°F | Wt 162.5 lb

## 2018-11-25 DIAGNOSIS — O24319 Unspecified pre-existing diabetes mellitus in pregnancy, unspecified trimester: Secondary | ICD-10-CM

## 2018-11-25 DIAGNOSIS — O0990 Supervision of high risk pregnancy, unspecified, unspecified trimester: Secondary | ICD-10-CM

## 2018-11-25 DIAGNOSIS — Z3A34 34 weeks gestation of pregnancy: Secondary | ICD-10-CM | POA: Diagnosis not present

## 2018-11-25 DIAGNOSIS — O09523 Supervision of elderly multigravida, third trimester: Secondary | ICD-10-CM

## 2018-11-25 DIAGNOSIS — Z3009 Encounter for other general counseling and advice on contraception: Secondary | ICD-10-CM

## 2018-11-25 DIAGNOSIS — D573 Sickle-cell trait: Secondary | ICD-10-CM

## 2018-11-25 DIAGNOSIS — Z789 Other specified health status: Secondary | ICD-10-CM | POA: Diagnosis not present

## 2018-11-25 DIAGNOSIS — Z98891 History of uterine scar from previous surgery: Secondary | ICD-10-CM

## 2018-11-25 DIAGNOSIS — O99013 Anemia complicating pregnancy, third trimester: Secondary | ICD-10-CM

## 2018-11-25 DIAGNOSIS — O24313 Unspecified pre-existing diabetes mellitus in pregnancy, third trimester: Secondary | ICD-10-CM

## 2018-11-25 DIAGNOSIS — O0993 Supervision of high risk pregnancy, unspecified, third trimester: Secondary | ICD-10-CM | POA: Diagnosis not present

## 2018-11-25 NOTE — Progress Notes (Signed)

## 2018-11-25 NOTE — Progress Notes (Signed)
   PRENATAL VISIT NOTE  Subjective:  Lisa Blake is a 38 y.o. G3P2002 at [redacted]w[redacted]d being seen today for ongoing prenatal care.  She is currently monitored for the following issues for this high-risk pregnancy and has Supervision of high risk pregnancy, antepartum; Preexisting diabetes complicating pregnancy, antepartum; AMA (advanced maternal age) multigravida 35+; Language barrier, speaks Spanish only; Anemia of pregnancy; History of cesarean delivery; Unwanted fertility; and Sickle cell trait ([place]) on their problem list.  Patient reports no complaints.  Contractions: Not present. Vag. Bleeding: None.  Movement: Present. Denies leaking of fluid.   The following portions of the patient's history were reviewed and updated as appropriate: allergies, current medications, past family history, past medical history, past social history, past surgical history and problem list.   Objective:   Vitals:   11/25/18 1100  BP: 132/87  Pulse: 81  Temp: 98.3 F (36.8 C)  Weight: 162 lb 8 oz (73.7 kg)    Fetal Status: Fetal Heart Rate (bpm): NST   Movement: Present     General:  Alert, oriented and cooperative. Patient is in no acute distress.  Skin: Skin is warm and dry. No rash noted.   Cardiovascular: Normal heart rate noted  Respiratory: Normal respiratory effort, no problems with respiration noted  Abdomen: Soft, gravid, appropriate for gestational age.  Pain/Pressure: Present     Pelvic: Cervical exam deferred        Extremities: Normal range of motion.  Edema: None  Mental Status: Normal mood and affect. Normal behavior. Normal judgment and thought content.   Assessment and Plan:  Pregnancy: G3P2002 at [redacted]w[redacted]d 1. Supervision of high risk pregnancy, antepartum - cultures at next visit  2. Preexisting diabetes complicating pregnancy, antepartum - she did not bring her sugars today, says that all fastings are less than 90, 2 hours range  3. Multigravida of advanced maternal age in  third trimester - low risk NIPS  4. Language barrier, speaks Spanish only - live interpretor present  5. History of cesarean delivery x 2 - plans repeat  6. Unwanted fertility - plans BTL, papers signed 10/15/18  7. Sickle cell trait ([facility])  - Culture, OB Urine  Preterm labor symptoms and general obstetric precautions including but not limited to vaginal bleeding, contractions, leaking of fluid and fetal movement were reviewed in detail with the patient. Please refer to After Visit Summary for other counseling recommendations.   Return in about 1 week (around 12/02/2018) for weekly as scheduled.  Future Appointments  Date Time Provider [place]  11/25/2018 11:35 AM [facility]AGING [facility]  12/04/2018 10:15 AM [facility] NST [facility] WOC  12/11/2018 10:15 AM [facility] NST [facility] WOC  12/11/2018 11:15 AM Emily Filbert, MD WOC-[facility]A WOC  12/13/2018  2:30 PM [place] [facility]-US  12/13/2018  2:30 PM [place] 5 [facility] M[facility]-US  12/18/2018 10:15 AM [facility]A NST WOC-[facility]A WOC  12/25/2018 10:15 AM Donnamae Jude, MD [facility]OC-[facility] WOC  12/25/2018 11:15 AM [facility] NST WOC-[facility]A WOC    Cheick Suhr Marijo Sanes, MD

## 2018-11-25 NOTE — Progress Notes (Signed)
Interpreter [employer] present for encounter. Pt reports constipation - no BM x3 days. She has only been taking Colace once daily - did not realize to take twice.

## 2018-11-25 NOTE — Progress Notes (Signed)
I have reviewed this chart and agree with the RN/CMA assessment and management.    Floree Zuniga C Andie Mungin, MD, FACOG Attending Physician, Faculty Practice [facility]  

## 2018-11-26 ENCOUNTER — Inpatient Hospital Stay (HOSPITAL_COMMUNITY)
Admission: AD | Admit: 2018-11-26 | Discharge: 2018-11-30 | DRG: 783 | Disposition: A | Discharge status: Home / Self Care | Payer: Medicaid Other | Attending: Obstetrics and Gynecology | Admitting: Obstetrics and Gynecology

## 2018-11-26 DIAGNOSIS — O09529 Supervision of elderly multigravida, unspecified trimester: Secondary | ICD-10-CM

## 2018-11-26 DIAGNOSIS — D573 Sickle-cell trait: Secondary | ICD-10-CM | POA: Diagnosis present

## 2018-11-26 DIAGNOSIS — O42013 Preterm premature rupture of membranes, onset of labor within 24 hours of rupture, third trimester: Secondary | ICD-10-CM | POA: Diagnosis present

## 2018-11-26 DIAGNOSIS — Z758 Other problems related to medical facilities and other health care: Secondary | ICD-10-CM | POA: Diagnosis present

## 2018-11-26 DIAGNOSIS — Z98891 History of uterine scar from previous surgery: Secondary | ICD-10-CM

## 2018-11-26 DIAGNOSIS — Z3A35 35 weeks gestation of pregnancy: Secondary | ICD-10-CM

## 2018-11-26 DIAGNOSIS — O34211 Maternal care for low transverse scar from previous cesarean delivery: Principal | ICD-10-CM | POA: Diagnosis present

## 2018-11-26 DIAGNOSIS — O42913 Preterm premature rupture of membranes, unspecified as to length of time between rupture and onset of labor, third trimester: Secondary | ICD-10-CM | POA: Diagnosis present

## 2018-11-26 DIAGNOSIS — E119 Type 2 diabetes mellitus without complications: Secondary | ICD-10-CM

## 2018-11-26 DIAGNOSIS — Z789 Other specified health status: Secondary | ICD-10-CM | POA: Diagnosis present

## 2018-11-26 DIAGNOSIS — O1414 Severe pre-eclampsia complicating childbirth: Secondary | ICD-10-CM | POA: Diagnosis present

## 2018-11-26 DIAGNOSIS — Z6791 Unspecified blood type, Rh negative: Secondary | ICD-10-CM

## 2018-11-26 DIAGNOSIS — O26893 Other specified pregnancy related conditions, third trimester: Secondary | ICD-10-CM | POA: Diagnosis present

## 2018-11-26 DIAGNOSIS — O26899 Other specified pregnancy related conditions, unspecified trimester: Secondary | ICD-10-CM

## 2018-11-26 DIAGNOSIS — Z20828 Contact with and (suspected) exposure to other viral communicable diseases: Secondary | ICD-10-CM | POA: Diagnosis present

## 2018-11-26 DIAGNOSIS — Z3009 Encounter for other general counseling and advice on contraception: Secondary | ICD-10-CM | POA: Diagnosis present

## 2018-11-26 DIAGNOSIS — Z302 Encounter for sterilization: Secondary | ICD-10-CM

## 2018-11-26 DIAGNOSIS — Z794 Long term (current) use of insulin: Secondary | ICD-10-CM

## 2018-11-26 DIAGNOSIS — O9902 Anemia complicating childbirth: Secondary | ICD-10-CM | POA: Diagnosis present

## 2018-11-26 DIAGNOSIS — O2412 Pre-existing diabetes mellitus, type 2, in childbirth: Secondary | ICD-10-CM | POA: Diagnosis present

## 2018-11-26 DIAGNOSIS — O24319 Unspecified pre-existing diabetes mellitus in pregnancy, unspecified trimester: Secondary | ICD-10-CM | POA: Diagnosis present

## 2018-11-26 DIAGNOSIS — O24011 Pre-existing diabetes mellitus, type 1, in pregnancy, first trimester: Secondary | ICD-10-CM

## 2018-11-27 ENCOUNTER — Encounter (HOSPITAL_COMMUNITY): Payer: Self-pay

## 2018-11-27 ENCOUNTER — Other Ambulatory Visit: Payer: Self-pay

## 2018-11-27 ENCOUNTER — Inpatient Hospital Stay (HOSPITAL_COMMUNITY): Payer: Medicaid Other | Admitting: Anesthesiology

## 2018-11-27 ENCOUNTER — Encounter (HOSPITAL_COMMUNITY)
Admission: AD | Disposition: A | Discharge status: Home / Self Care | Payer: Self-pay | Source: Home / Self Care | Attending: Obstetrics and Gynecology

## 2018-11-27 DIAGNOSIS — O42013 Preterm premature rupture of membranes, onset of labor within 24 hours of rupture, third trimester: Secondary | ICD-10-CM | POA: Diagnosis present

## 2018-11-27 DIAGNOSIS — O26893 Other specified pregnancy related conditions, third trimester: Secondary | ICD-10-CM | POA: Diagnosis present

## 2018-11-27 DIAGNOSIS — Z98891 History of uterine scar from previous surgery: Secondary | ICD-10-CM

## 2018-11-27 DIAGNOSIS — O34211 Maternal care for low transverse scar from previous cesarean delivery: Secondary | ICD-10-CM | POA: Diagnosis present

## 2018-11-27 DIAGNOSIS — O1414 Severe pre-eclampsia complicating childbirth: Secondary | ICD-10-CM | POA: Diagnosis present

## 2018-11-27 DIAGNOSIS — Z302 Encounter for sterilization: Secondary | ICD-10-CM

## 2018-11-27 DIAGNOSIS — D573 Sickle-cell trait: Secondary | ICD-10-CM | POA: Diagnosis present

## 2018-11-27 DIAGNOSIS — O42913 Preterm premature rupture of membranes, unspecified as to length of time between rupture and onset of labor, third trimester: Secondary | ICD-10-CM | POA: Diagnosis present

## 2018-11-27 DIAGNOSIS — O2412 Pre-existing diabetes mellitus, type 2, in childbirth: Secondary | ICD-10-CM | POA: Diagnosis present

## 2018-11-27 DIAGNOSIS — O9902 Anemia complicating childbirth: Secondary | ICD-10-CM | POA: Diagnosis present

## 2018-11-27 DIAGNOSIS — E119 Type 2 diabetes mellitus without complications: Secondary | ICD-10-CM | POA: Diagnosis present

## 2018-11-27 DIAGNOSIS — Z794 Long term (current) use of insulin: Secondary | ICD-10-CM | POA: Diagnosis not present

## 2018-11-27 DIAGNOSIS — Z3A35 35 weeks gestation of pregnancy: Secondary | ICD-10-CM | POA: Diagnosis not present

## 2018-11-27 DIAGNOSIS — Z20828 Contact with and (suspected) exposure to other viral communicable diseases: Secondary | ICD-10-CM | POA: Diagnosis present

## 2018-11-27 DIAGNOSIS — Z6791 Unspecified blood type, Rh negative: Secondary | ICD-10-CM | POA: Diagnosis not present

## 2018-11-27 LAB — COMPREHENSIVE METABOLIC PANEL
ALT: 24 U/L (ref 0–44)
AST: 26 U/L (ref 15–41)
Albumin: 2.4 g/dL — ABNORMAL LOW (ref 3.5–5.0)
Alkaline Phosphatase: 109 U/L (ref 38–126)
Anion gap: 6 (ref 5–15)
BUN: 9 mg/dL (ref 6–20)
CO2: 22 mmol/L (ref 22–32)
Calcium: 8.4 mg/dL — ABNORMAL LOW (ref 8.9–10.3)
Chloride: 109 mmol/L (ref 98–111)
Creatinine, Ser: 0.52 mg/dL (ref 0.44–1.00)
GFR calc Af Amer: >60 mL/min (ref >60)
GFR calc non Af Amer: >60 mL/min (ref >60)
Glucose, Bld: 159 mg/dL — ABNORMAL HIGH (ref 70–99)
Potassium: 3.8 mmol/L (ref 3.5–5.1)
Sodium: 137 mmol/L (ref 135–145)
Total Bilirubin: 0.4 mg/dL (ref 0.3–1.2)
Total Protein: 6.3 g/dL — ABNORMAL LOW (ref 6.5–8.1)

## 2018-11-27 LAB — CBC
HCT: 35.5 % — ABNORMAL LOW (ref 36.0–46.0)
HCT: 34.6 % — ABNORMAL LOW (ref 36.0–46.0)
Hemoglobin: 10.8 g/dL — ABNORMAL LOW (ref 12.0–15.0)
Hemoglobin: 10.5 g/dL — ABNORMAL LOW (ref 12.0–15.0)
MCH: 21.6 pg — ABNORMAL LOW (ref 26.0–34.0)
MCH: 21.4 pg — ABNORMAL LOW (ref 26.0–34.0)
MCHC: 30.4 g/dL (ref 30.0–36.0)
MCHC: 30.3 g/dL (ref 30.0–36.0)
MCV: 71 fL — ABNORMAL LOW (ref 80.0–100.0)
MCV: 70.6 fL — ABNORMAL LOW (ref 80.0–100.0)
Platelets: 183 10*3/uL (ref 150–400)
Platelets: 181 10*3/uL (ref 150–400)
RBC: 5 MIL/uL (ref 3.87–5.11)
RBC: 4.9 MIL/uL (ref 3.87–5.11)
RDW: 20.9 % — ABNORMAL HIGH (ref 11.5–15.5)
RDW: 20.7 % — ABNORMAL HIGH (ref 11.5–15.5)
WBC: 10.6 10*3/uL — ABNORMAL HIGH (ref 4.0–10.5)
WBC: 17.8 10*3/uL — ABNORMAL HIGH (ref 4.0–10.5)
nRBC: 0 % (ref 0.0–0.2)
nRBC: 0 % (ref 0.0–0.2)

## 2018-11-27 LAB — GLUCOSE, CAPILLARY
Glucose-Capillary: 197 mg/dL — ABNORMAL HIGH (ref 70–99)
Glucose-Capillary: 246 mg/dL — ABNORMAL HIGH (ref 70–99)
Glucose-Capillary: 209 mg/dL — ABNORMAL HIGH (ref 70–99)
Glucose-Capillary: 134 mg/dL — ABNORMAL HIGH (ref 70–99)
Glucose-Capillary: 73 mg/dL (ref 70–99)
Glucose-Capillary: 225 mg/dL — ABNORMAL HIGH (ref 70–99)
Glucose-Capillary: 269 mg/dL — ABNORMAL HIGH (ref 70–99)

## 2018-11-27 LAB — CULTURE, OB URINE

## 2018-11-27 LAB — TYPE AND SCREEN
ABO/RH(D): A NEG
Antibody Screen: NEGATIVE

## 2018-11-27 LAB — RAPID HIV SCREEN (HIV 1/2 AB+AG)
HIV 1/2 Antibodies: NONREACTIVE
HIV-1 P24 Antigen - HIV24: NONREACTIVE

## 2018-11-27 LAB — RPR: RPR Ser Ql: NONREACTIVE

## 2018-11-27 LAB — SARS CORONAVIRUS 2 BY RT PCR (HOSPITAL ORDER, PERFORMED IN ~~LOC~~ HOSPITAL LAB): SARS Coronavirus 2: NEGATIVE

## 2018-11-27 LAB — URINE CULTURE, OB REFLEX

## 2018-11-27 SURGERY — Surgical Case
Anesthesia: Spinal | Wound class: Clean Contaminated

## 2018-11-27 MED ORDER — KETOROLAC TROMETHAMINE 30 MG/ML IJ SOLN
INTRAMUSCULAR | Status: AC
  Filled 2018-11-27: qty 1

## 2018-11-27 MED ORDER — ACETAMINOPHEN 500 MG PO TABS: 1000.0000 mg | ORAL_TABLET | Freq: Once | ORAL | Status: DC

## 2018-11-27 MED ORDER — LACTATED RINGERS IV SOLN
125.0000 mL/h | INTRAVENOUS | Status: DC
  Administered 2018-11-27: 125 mL/h via INTRAVENOUS

## 2018-11-27 MED ORDER — WITCH HAZEL-GLYCERIN EX PADS: 1.0000 "application " | MEDICATED_PAD | CUTANEOUS | Status: DC | PRN

## 2018-11-27 MED ORDER — LABETALOL HCL 5 MG/ML IV SOLN
80.0000 mg | INTRAVENOUS | Status: DC | PRN
  Administered 2018-11-27: 80 mg via INTRAVENOUS
  Filled 2018-11-27: qty 16

## 2018-11-27 MED ORDER — INSULIN ASPART 100 UNIT/ML ~~LOC~~ SOLN
5.0000 [IU] | Freq: Once | SUBCUTANEOUS | Status: AC
  Administered 2018-11-27: 5 [IU] via SUBCUTANEOUS

## 2018-11-27 MED ORDER — TERBUTALINE SULFATE 1 MG/ML IJ SOLN
0.2500 mg | Freq: Once | INTRAMUSCULAR | Status: AC
  Administered 2018-11-27: 0.25 mg via SUBCUTANEOUS
  Filled 2018-11-27: qty 1

## 2018-11-27 MED ORDER — KETOROLAC TROMETHAMINE 30 MG/ML IJ SOLN
30.0000 mg | Freq: Four times a day (QID) | INTRAMUSCULAR | Status: AC
  Administered 2018-11-27 (×3): 30 mg via INTRAVENOUS
  Filled 2018-11-27 (×3): qty 1

## 2018-11-27 MED ORDER — SODIUM CHLORIDE 0.9% FLUSH
3.0000 mL | INTRAVENOUS | Status: DC | PRN
  Administered 2018-11-28: 3 mL via INTRAVENOUS
  Filled 2018-11-27: qty 3

## 2018-11-27 MED ORDER — LABETALOL HCL 5 MG/ML IV SOLN
20.0000 mg | INTRAVENOUS | Status: DC | PRN
  Administered 2018-11-27: 20 mg via INTRAVENOUS

## 2018-11-27 MED ORDER — PANTOPRAZOLE SODIUM 20 MG PO TBEC
20.0000 mg | DELAYED_RELEASE_TABLET | Freq: Every day | ORAL | Status: DC
  Administered 2018-11-28 – 2018-11-30 (×3): 20 mg via ORAL
  Filled 2018-11-27 (×4): qty 1

## 2018-11-27 MED ORDER — SOD CITRATE-CITRIC ACID 500-334 MG/5ML PO SOLN
30.0000 mL | Freq: Once | ORAL | Status: AC
  Administered 2018-11-27: 30 mL via ORAL
  Filled 2018-11-27: qty 30

## 2018-11-27 MED ORDER — FENTANYL CITRATE (PF) 100 MCG/2ML IJ SOLN: 25.0000 ug | INTRAMUSCULAR | Status: DC | PRN

## 2018-11-27 MED ORDER — HYDRALAZINE HCL 20 MG/ML IJ SOLN: 10.0000 mg | INTRAMUSCULAR | Status: DC | PRN

## 2018-11-27 MED ORDER — ONDANSETRON HCL 4 MG/2ML IJ SOLN
INTRAMUSCULAR | Status: AC
  Filled 2018-11-27: qty 2

## 2018-11-27 MED ORDER — INSULIN ASPART 100 UNIT/ML ~~LOC~~ SOLN
0.0000 [IU] | Freq: Three times a day (TID) | SUBCUTANEOUS | Status: DC
  Administered 2018-11-27: 5 [IU] via SUBCUTANEOUS
  Administered 2018-11-28: 3 [IU] via SUBCUTANEOUS
  Administered 2018-11-28: 2 [IU] via SUBCUTANEOUS
  Administered 2018-11-28: 3 [IU] via SUBCUTANEOUS

## 2018-11-27 MED ORDER — ACETAMINOPHEN 160 MG/5ML PO SOLN: 1000.0000 mg | Freq: Once | ORAL | Status: DC

## 2018-11-27 MED ORDER — INSULIN GLARGINE 100 UNIT/ML ~~LOC~~ SOLN
15.0000 [IU] | Freq: Every day | SUBCUTANEOUS | Status: DC
  Filled 2018-11-27 (×2): qty 0.15

## 2018-11-27 MED ORDER — LACTATED RINGERS IV SOLN
INTRAVENOUS | Status: DC | PRN
  Administered 2018-11-27: 03:00:00 via INTRAVENOUS

## 2018-11-27 MED ORDER — LACTATED RINGERS IV SOLN
INTRAVENOUS | Status: DC
  Administered 2018-11-27 (×2): via INTRAVENOUS

## 2018-11-27 MED ORDER — PRENATAL MULTIVITAMIN CH
1.0000 | ORAL_TABLET | Freq: Every day | ORAL | Status: DC
  Administered 2018-11-28 – 2018-11-30 (×3): 1 via ORAL
  Filled 2018-11-27 (×3): qty 1

## 2018-11-27 MED ORDER — INSULIN ASPART 100 UNIT/ML ~~LOC~~ SOLN
0.0000 [IU] | Freq: Every day | SUBCUTANEOUS | Status: DC
  Administered 2018-11-27: 3 [IU] via SUBCUTANEOUS

## 2018-11-27 MED ORDER — TETANUS-DIPHTH-ACELL PERTUSSIS 5-2.5-18.5 LF-MCG/0.5 IM SUSP: 0.5000 mL | Freq: Once | INTRAMUSCULAR | Status: DC

## 2018-11-27 MED ORDER — ACETAMINOPHEN 500 MG PO TABS
1000.0000 mg | ORAL_TABLET | Freq: Four times a day (QID) | ORAL | Status: AC
  Administered 2018-11-27 (×3): 1000 mg via ORAL
  Filled 2018-11-27 (×3): qty 2

## 2018-11-27 MED ORDER — FENTANYL CITRATE (PF) 100 MCG/2ML IJ SOLN
INTRAMUSCULAR | Status: AC
  Filled 2018-11-27: qty 2

## 2018-11-27 MED ORDER — DIPHENHYDRAMINE HCL 25 MG PO CAPS: 25.0000 mg | ORAL_CAPSULE | ORAL | Status: DC | PRN

## 2018-11-27 MED ORDER — BETAMETHASONE SOD PHOS & ACET 6 (3-3) MG/ML IJ SUSP
12.0000 mg | Freq: Once | INTRAMUSCULAR | Status: AC
  Administered 2018-11-27: 12 mg via INTRAMUSCULAR
  Filled 2018-11-27 (×2): qty 2

## 2018-11-27 MED ORDER — PROMETHAZINE HCL 25 MG/ML IJ SOLN: 6.2500 mg | INTRAMUSCULAR | Status: DC | PRN

## 2018-11-27 MED ORDER — DIPHENHYDRAMINE HCL 25 MG PO CAPS
25.0000 mg | ORAL_CAPSULE | Freq: Four times a day (QID) | ORAL | Status: DC | PRN
  Administered 2018-11-27: 25 mg via ORAL
  Filled 2018-11-27: qty 1

## 2018-11-27 MED ORDER — KETOROLAC TROMETHAMINE 30 MG/ML IJ SOLN: 30.0000 mg | Freq: Once | INTRAMUSCULAR | Status: DC

## 2018-11-27 MED ORDER — MAGNESIUM SULFATE BOLUS VIA INFUSION
4.0000 g | Freq: Once | INTRAVENOUS | Status: AC
  Administered 2018-11-27: 4 g via INTRAVENOUS
  Filled 2018-11-27: qty 500

## 2018-11-27 MED ORDER — ONDANSETRON HCL 4 MG/2ML IJ SOLN
INTRAMUSCULAR | Status: DC | PRN
  Administered 2018-11-27: 4 mg via INTRAVENOUS

## 2018-11-27 MED ORDER — ONDANSETRON HCL 4 MG/2ML IJ SOLN
4.0000 mg | Freq: Three times a day (TID) | INTRAMUSCULAR | Status: DC | PRN
  Administered 2018-11-27: 4 mg via INTRAVENOUS
  Filled 2018-11-27: qty 2

## 2018-11-27 MED ORDER — OXYTOCIN 40 UNITS IN NORMAL SALINE INFUSION - SIMPLE MED: 2.5000 [IU]/h | INTRAVENOUS | Status: AC

## 2018-11-27 MED ORDER — MORPHINE SULFATE (PF) 0.5 MG/ML IJ SOLN
INTRAMUSCULAR | Status: AC
  Filled 2018-11-27: qty 10

## 2018-11-27 MED ORDER — SIMETHICONE 80 MG PO CHEW
80.0000 mg | CHEWABLE_TABLET | Freq: Three times a day (TID) | ORAL | Status: DC
  Administered 2018-11-27 – 2018-11-30 (×10): 80 mg via ORAL
  Filled 2018-11-27 (×10): qty 1

## 2018-11-27 MED ORDER — SODIUM CHLORIDE 0.9 % IV SOLN
8.0000 mg | Freq: Once | INTRAVENOUS | Status: AC
  Administered 2018-11-27: 8 mg via INTRAVENOUS
  Filled 2018-11-27 (×2): qty 4

## 2018-11-27 MED ORDER — NALBUPHINE HCL 10 MG/ML IJ SOLN: 5.0000 mg | INTRAMUSCULAR | Status: DC | PRN

## 2018-11-27 MED ORDER — INSULIN ASPART 100 UNIT/ML ~~LOC~~ SOLN
6.0000 [IU] | Freq: Once | SUBCUTANEOUS | Status: AC
  Administered 2018-11-27: 6 [IU] via SUBCUTANEOUS

## 2018-11-27 MED ORDER — CEFAZOLIN SODIUM-DEXTROSE 2-4 GM/100ML-% IV SOLN
2.0000 g | INTRAVENOUS | Status: AC
  Administered 2018-11-27: 03:00:00 2 g via INTRAVENOUS
  Filled 2018-11-27: qty 100

## 2018-11-27 MED ORDER — SIMETHICONE 80 MG PO CHEW
80.0000 mg | CHEWABLE_TABLET | ORAL | Status: DC
  Administered 2018-11-27 – 2018-11-29 (×3): 80 mg via ORAL
  Filled 2018-11-27 (×3): qty 1

## 2018-11-27 MED ORDER — PHENYLEPHRINE HCL-NACL 20-0.9 MG/250ML-% IV SOLN
INTRAVENOUS | Status: AC
  Filled 2018-11-27: qty 250

## 2018-11-27 MED ORDER — MORPHINE SULFATE (PF) 0.5 MG/ML IJ SOLN
INTRAMUSCULAR | Status: DC | PRN
  Administered 2018-11-27: .15 mg via INTRATHECAL

## 2018-11-27 MED ORDER — COCONUT OIL OIL: 1.0000 "application " | TOPICAL_OIL | Status: DC | PRN

## 2018-11-27 MED ORDER — ZOLPIDEM TARTRATE 5 MG PO TABS: 5.0000 mg | ORAL_TABLET | Freq: Every evening | ORAL | Status: DC | PRN

## 2018-11-27 MED ORDER — ENOXAPARIN SODIUM 40 MG/0.4ML ~~LOC~~ SOLN
40.0000 mg | SUBCUTANEOUS | Status: DC
  Administered 2018-11-28 – 2018-11-29 (×2): 40 mg via SUBCUTANEOUS
  Filled 2018-11-27 (×2): qty 0.4

## 2018-11-27 MED ORDER — SODIUM CHLORIDE 0.9 % IV SOLN
INTRAVENOUS | Status: DC | PRN
  Administered 2018-11-27 (×2): via INTRAVENOUS

## 2018-11-27 MED ORDER — SENNOSIDES-DOCUSATE SODIUM 8.6-50 MG PO TABS
2.0000 | ORAL_TABLET | ORAL | Status: DC
  Administered 2018-11-27 – 2018-11-29 (×3): 2 via ORAL
  Filled 2018-11-27 (×3): qty 2

## 2018-11-27 MED ORDER — KETOROLAC TROMETHAMINE 30 MG/ML IJ SOLN
30.0000 mg | Freq: Four times a day (QID) | INTRAMUSCULAR | Status: AC | PRN
  Administered 2018-11-27: 30 mg via INTRAMUSCULAR

## 2018-11-27 MED ORDER — MAGNESIUM SULFATE 40 G IN LACTATED RINGERS - SIMPLE
2.0000 g/h | INTRAVENOUS | Status: AC
  Administered 2018-11-27 – 2018-11-28 (×2): 2 g/h via INTRAVENOUS
  Filled 2018-11-27 (×3): qty 500

## 2018-11-27 MED ORDER — FENTANYL CITRATE (PF) 100 MCG/2ML IJ SOLN
INTRAMUSCULAR | Status: DC | PRN
  Administered 2018-11-27: 15 ug via INTRATHECAL

## 2018-11-27 MED ORDER — SODIUM CHLORIDE 0.9 % IV SOLN
500.0000 mg | Freq: Once | INTRAVENOUS | Status: AC
  Administered 2018-11-27: 03:00:00 500 mg via INTRAVENOUS
  Filled 2018-11-27: qty 500

## 2018-11-27 MED ORDER — NALOXONE HCL 0.4 MG/ML IJ SOLN: 0.4000 mg | INTRAMUSCULAR | Status: DC | PRN

## 2018-11-27 MED ORDER — FAMOTIDINE IN NACL 20-0.9 MG/50ML-% IV SOLN
20.0000 mg | Freq: Once | INTRAVENOUS | Status: AC
  Administered 2018-11-27: 20 mg via INTRAVENOUS
  Filled 2018-11-27: qty 50

## 2018-11-27 MED ORDER — SIMETHICONE 80 MG PO CHEW: 80.0000 mg | CHEWABLE_TABLET | ORAL | Status: DC | PRN

## 2018-11-27 MED ORDER — SODIUM CHLORIDE 0.9 % IV SOLN
INTRAVENOUS | Status: DC | PRN
  Administered 2018-11-27: 40 [IU] via INTRAVENOUS

## 2018-11-27 MED ORDER — LABETALOL HCL 5 MG/ML IV SOLN
40.0000 mg | INTRAVENOUS | Status: DC | PRN
  Administered 2018-11-27: 13:00:00 40 mg via INTRAVENOUS
  Filled 2018-11-27: qty 8

## 2018-11-27 MED ORDER — KETOROLAC TROMETHAMINE 30 MG/ML IJ SOLN: 30.0000 mg | Freq: Four times a day (QID) | INTRAMUSCULAR | Status: AC | PRN

## 2018-11-27 MED ORDER — IBUPROFEN 800 MG PO TABS
800.0000 mg | ORAL_TABLET | Freq: Four times a day (QID) | ORAL | Status: DC
  Administered 2018-11-28 – 2018-11-30 (×10): 800 mg via ORAL
  Filled 2018-11-27 (×10): qty 1

## 2018-11-27 MED ORDER — NALBUPHINE HCL 10 MG/ML IJ SOLN
5.0000 mg | Freq: Once | INTRAMUSCULAR | Status: AC | PRN
  Administered 2018-11-27: 5 mg via INTRAVENOUS

## 2018-11-27 MED ORDER — LABETALOL HCL 5 MG/ML IV SOLN
INTRAVENOUS | Status: AC
  Administered 2018-11-27: 40 mg via INTRAVENOUS
  Filled 2018-11-27: qty 4

## 2018-11-27 MED ORDER — DIPHENHYDRAMINE HCL 50 MG/ML IJ SOLN: 12.5000 mg | INTRAMUSCULAR | Status: DC | PRN

## 2018-11-27 MED ORDER — BUPIVACAINE IN DEXTROSE 0.75-8.25 % IT SOLN
INTRATHECAL | Status: DC | PRN
  Administered 2018-11-27: 1.7 mL via INTRATHECAL

## 2018-11-27 MED ORDER — NALBUPHINE HCL 10 MG/ML IJ SOLN
INTRAMUSCULAR | Status: AC
  Filled 2018-11-27: qty 1

## 2018-11-27 MED ORDER — NALBUPHINE HCL 10 MG/ML IJ SOLN: 5.0000 mg | Freq: Once | INTRAMUSCULAR | Status: AC | PRN

## 2018-11-27 MED ORDER — MENTHOL 3 MG MT LOZG: 1.0000 | LOZENGE | OROMUCOSAL | Status: DC | PRN

## 2018-11-27 MED ORDER — NALOXONE HCL 4 MG/10ML IJ SOLN
1.0000 ug/kg/h | INTRAVENOUS | Status: DC | PRN
  Filled 2018-11-27: qty 5

## 2018-11-27 MED ORDER — DIBUCAINE (PERIANAL) 1 % EX OINT: 1.0000 "application " | TOPICAL_OINTMENT | CUTANEOUS | Status: DC | PRN

## 2018-11-27 MED ORDER — OXYCODONE-ACETAMINOPHEN 5-325 MG PO TABS
1.0000 | ORAL_TABLET | ORAL | Status: DC | PRN
  Administered 2018-11-28: 2 via ORAL
  Administered 2018-11-29: 1 via ORAL
  Filled 2018-11-27: qty 2
  Filled 2018-11-27: qty 1

## 2018-11-27 MED ORDER — FERROUS SULFATE 325 (65 FE) MG PO TABS
325.0000 mg | ORAL_TABLET | Freq: Two times a day (BID) | ORAL | Status: DC
  Administered 2018-11-27 – 2018-11-30 (×6): 325 mg via ORAL
  Filled 2018-11-27 (×6): qty 1

## 2018-11-27 MED ORDER — OXYTOCIN 40 UNITS IN NORMAL SALINE INFUSION - SIMPLE MED
INTRAVENOUS | Status: AC
  Filled 2018-11-27: qty 1000

## 2018-11-27 SURGICAL SUPPLY — 37 items
BENZOIN TINCTURE PRP APPL 2/3 (GAUZE/BANDAGES/DRESSINGS) ×3 IMPLANT
CHLORAPREP W/TINT 26ML (MISCELLANEOUS) ×3 IMPLANT
CLAMP CORD UMBIL (MISCELLANEOUS) IMPLANT
CLIP FILSHIE TUBAL LIGA STRL (Clip) ×6 IMPLANT
CLOSURE WOUND 1/2 X4 (GAUZE/BANDAGES/DRESSINGS) ×1
CLOSURE WOUND 1/4 X3 (GAUZE/BANDAGES/DRESSINGS) ×1
CLOTH BEACON ORANGE TIMEOUT ST (SAFETY) ×3 IMPLANT
DRSG OPSITE POSTOP 4X10 (GAUZE/BANDAGES/DRESSINGS) ×3 IMPLANT
ELECT REM PT RETURN 9FT ADLT (ELECTROSURGICAL) ×3
ELECTRODE REM PT RTRN 9FT ADLT (ELECTROSURGICAL) ×1 IMPLANT
EXTRACTOR VACUUM M CUP 4 TUBE (SUCTIONS) IMPLANT
EXTRACTOR VACUUM M CUP 4' TUBE (SUCTIONS)
GAUZE SPONGE 4X4 12PLY STRL LF (GAUZE/BANDAGES/DRESSINGS) ×9 IMPLANT
GLOVE BIOGEL PI IND STRL 7.0 (GLOVE) ×2 IMPLANT
GLOVE BIOGEL PI IND STRL 7.5 (GLOVE) ×2 IMPLANT
GLOVE BIOGEL PI INDICATOR 7.0 (GLOVE) ×4
GLOVE BIOGEL PI INDICATOR 7.5 (GLOVE) ×4
GLOVE ECLIPSE 7.5 STRL STRAW (GLOVE) ×3 IMPLANT
GOWN STRL REUS W/TWL LRG LVL3 (GOWN DISPOSABLE) ×9 IMPLANT
KIT ABG SYR 3ML LUER SLIP (SYRINGE) IMPLANT
NEEDLE HYPO 25X5/8 SAFETYGLIDE (NEEDLE) IMPLANT
NS IRRIG 1000ML POUR BTL (IV SOLUTION) ×3 IMPLANT
PACK C SECTION WH (CUSTOM PROCEDURE TRAY) ×3 IMPLANT
PAD ABD 7.5X8 STRL (GAUZE/BANDAGES/DRESSINGS) ×3 IMPLANT
PAD OB MATERNITY 4.3X12.25 (PERSONAL CARE ITEMS) ×3 IMPLANT
PENCIL SMOKE EVAC W/HOLSTER (ELECTROSURGICAL) ×3 IMPLANT
RTRCTR C-SECT PINK 25CM LRG (MISCELLANEOUS) ×3 IMPLANT
STRIP CLOSURE SKIN 1/2X4 (GAUZE/BANDAGES/DRESSINGS) ×2 IMPLANT
STRIP CLOSURE SKIN 1/4X3 (GAUZE/BANDAGES/DRESSINGS) ×2 IMPLANT
SUT VIC AB 0 CTX 36 (SUTURE) ×6
SUT VIC AB 0 CTX36XBRD ANBCTRL (SUTURE) ×3 IMPLANT
SUT VIC AB 2-0 CT1 27 (SUTURE) ×2
SUT VIC AB 2-0 CT1 TAPERPNT 27 (SUTURE) ×1 IMPLANT
SUT VIC AB 4-0 KS 27 (SUTURE) ×3 IMPLANT
TOWEL OR 17X24 6PK STRL BLUE (TOWEL DISPOSABLE) ×3 IMPLANT
TRAY FOLEY W/BAG SLVR 14FR LF (SET/KITS/TRAYS/PACK) ×3 IMPLANT
WATER STERILE IRR 1000ML POUR (IV SOLUTION) ×3 IMPLANT

## 2018-11-27 NOTE — Transfer of Care (Signed)
Immediate Anesthesia Transfer of Care Note  Patient: Lisa Blake [facility]  Procedure(s) Performed: CESAREAN SECTION (N/A )  Patient Location: PACU  Anesthesia Type:Spinal  Level of Consciousness: awake, alert  and oriented  Airway & Oxygen Therapy: Patient Spontanous Breathing  Post-op Assessment: Report given to RN and Post -op Vital signs reviewed and stable  Post vital signs: Reviewed and stable HR 96, RR 20, Sao2 100%, BP 124/79  Last Vitals:  Vitals Value Taken Time  BP    Temp    Pulse    Resp    SpO2      Last Pain:  Vitals:   11/27/18 0021  PainSc: 9          Complications: No apparent anesthesia complications

## 2018-11-27 NOTE — H&P (Signed)
[facility]  Lisa MylarFarah Antonia Ames CoupeRobles [name] [phone] with IUP at 7575w1d presenting for elective repeat cesarean section for gross PPROM. Pregnancy was been complicated by language barrier, type 2 diabetes.    Pt states she has been having regular contractions, no vaginal bleeding, intact membranes, with normal fetal movement.     Prenatal Course Source of Care: [facility] with onset of care at 13 weeks  Pregnancy complications or risks: Patient Active Problem List   Diagnosis Date Noted  . Sickle cell trait (HCC) 11/25/2018  . Unwanted fertility 10/14/2018  . History of cesarean delivery 09/30/2018  . Anemia of pregnancy 07/12/2018  . Supervision of high risk pregnancy, antepartum 06/28/2018  . Preexisting diabetes complicating pregnancy, antepartum 06/28/2018  . AMA (advanced maternal age) multigravida 35+ 06/28/2018  . Language barrier, speaks Spanish only 06/28/2018   She desires bilateral tubal ligation for contraception.  She plans to breastfeed  Prenatal labs and studies: ABO, Rh: A/Positive/-- (02/28 1208) Antibody: Negative (02/28 1208) Rubella: 27.70 (02/28 1208) RPR: Non Reactive (06/15 1038)  HBsAg: Negative (02/28 1208)  HIV: Non Reactive (06/15 1038)  GBS:    2hr Glucola: preexisting diabetes Genetic screening: normal Anatomy US: normal  Past Medical History:  Past Medical History:  Diagnosis Date  . Diabetes mellitus without complication (HCC)   . DM II (diabetes mellitus, type II), controlled (HCC)    Diagnosed after pregnancy, was 38 years old    Past Surgical History:  Past Surgical History:  Procedure Laterality Date  . CESAREAN SECTION      Obstetrical History:  OB History    Gravida  3   Para  2   Term  2   Preterm  0   AB      Living  2     SAB      TAB      Ectopic      Multiple      Live Births  2           Gynecological History:  OB History    Gravida  3   Para  2   Term   2   Preterm  0   AB      Living  2     SAB      TAB      Ectopic      Multiple      Live Births  2           Social History:  Social History   Socioeconomic History  . Marital status: Single    Spouse name: Not on file  . Number of children: Not on file  . Years of education: Not on file  . Highest education level: Not on file  Occupational History  . Not on file  Social Needs  . Financial resource strain: Not on file  . Food insecurity    Worry: Often true    Inability: Sometimes true  . Transportation needs    Medical: No    Non-medical: No  Tobacco Use  . Smoking status: Never Smoker  . Smokeless tobacco: Never Used  Substance and Sexual Activity  . Alcohol use: Never    Frequency: Never  . Drug use: Never  . Sexual activity: Yes    Birth control/protection: None  Lifestyle  . Physical activity    Days per week: Not on file    Minutes per session: Not on file  .  Stress: Not on file  Relationships  . Social Herbalist on phone: Not on file    Gets together: Not on file    Attends religious service: Not on file    Active member of club or organization: Not on file    Attends meetings of clubs or organizations: Not on file    Relationship status: Not on file  Other Topics Concern  . Not on file  Social History Narrative  . Not on file    Family History:  Family History  Problem Relation Age of Onset  . Hypertension Mother   . Diabetes Father     Medications:  Prenatal vitamins,  Current Facility-Administered Medications  Medication Dose Route Frequency Provider Last Rate Last Dose  . azithromycin (ZITHROMAX) 500 mg in sodium chloride 0.9 % 250 mL IVPB  500 mg Intravenous Once Kennetta Pavlovic Blake, Lisa Blake      . betamethasone acetate-betamethasone sodium phosphate (CELESTONE) injection 12 mg  12 mg Intramuscular Once Rebeka Kimble Blake, Lisa Blake      . ceFAZolin (ANCEF) IVPB 2g/100 mL premix  2 g Intravenous On Call to OR Truett Mainland,  Lisa Blake      . lactated ringers infusion  125 mL/hr Intravenous Continuous Truett Mainland, Lisa Blake      . terbutaline (BRETHINE) injection 0.25 mg  0.25 mg Subcutaneous Once Truett Mainland, Lisa Blake        Allergies:  Allergies  Allergen Reactions  . Penicillins     Unknown reaction    Review of Systems: - negative  Physical Exam: Blood pressure (!) 147/88, pulse 80, temperature 98.3 F (36.8 C), resp. rate 16, last menstrual period 04/09/2018, SpO2 100 %. GENERAL: Well-developed, well-nourished female in no acute distress.  LUNGS: Clear to auscultation bilaterally.  HEART: Regular rate and rhythm. ABDOMEN: Soft, nontender, nondistended, gravid. EFW 5 lbs EXTREMITIES: Nontender, no edema, 2+ distal pulses. Cervical Exam:  Dilation: Fingertip Effacement (%): 50 Cervical Position: Posterior Exam by:: Gavin Pound, CNM    Presentation: cephalic FHT:  Baseline rate 140-150 bpm   Variability moderate  Accelerations absent   Decelerations variable Contractions: Every 4 mins   Pertinent Labs/Studies:   Lab Results  Component Value Date   WBC 6.9 10/14/2018   HGB 10.1 (L) 10/14/2018   HCT 32.7 (L) 10/14/2018   MCV 68 (L) 10/14/2018   PLT 201 10/14/2018    Assessment : Lisa Blake is a 38 y.o. G3P2002 at [redacted]w[redacted]d being admitted for cesarean section secondary to elective repeat, PPROM. She desires BTL.   Plan: The risks of cesarean section discussed with the patient included but were not limited to: bleeding which may require transfusion or reoperation; infection which may require antibiotics; injury to bowel, bladder, ureters or other surrounding organs; injury to the fetus; need for additional procedures including hysterectomy in the event of a life-threatening hemorrhage; placental abnormalities wth subsequent pregnancies, incisional problems, thromboembolic phenomenon and other postoperative/anesthesia complications. The patient concurred with the proposed plan, giving  informed written consent for the procedure.   Patient has been NPO since 8pm and will remain NPO for procedure.  Preoperative prophylactic Ancef and azithromycin ordered on call to the OR.   Discussed 1% failure rate of BTL.  Will also give BMZ as baby premature. I discussed patient with neonatology and anesthesiology.  Variable decelerations improving - I think we can wait for COVID testing to return, which would be about 6 hours after last meal (spagetti and [employer]  sauce, no protein). Anesthesiologist amenable to this.   Levie HeritageStinson, Lisa Disch Blake, Lisa Blake 11/27/2018, 12:55 AM

## 2018-11-27 NOTE — Progress Notes (Signed)
Patient nauseated and vomiting.  Anesthesia called and order given for Zofran 8mg  and continue to monitor.  Hold insulin coverage at this time also since patient vomiting and not eating per Anesthesia. Lisa Blake D

## 2018-11-27 NOTE — Op Note (Signed)
Lisa Blake PROCEDURE DATE: 11/26/2018 - 11/27/2018  PREOPERATIVE DIAGNOSIS: Intrauterine pregnancy at  [redacted]w[redacted]d weeks gestation; prior cesaeran, PPROM  POSTOPERATIVE DIAGNOSIS: The same  PROCEDURE: Repeat Low Transverse Cesarean Section with tubal ligation with filshie clips  SURGEON:  Dr. Loma Boston  ASSISTANT: none  INDICATIONS: Lisa Blake is a 38 y.o. [phone] at [redacted]w[redacted]d scheduled for cesarean section secondary to prior cesarean, PPROM.  The risks of cesarean section discussed with the patient included but were not limited to: bleeding which may require transfusion or reoperation; infection which may require antibiotics; injury to bowel, bladder, ureters or other surrounding organs; injury to the fetus; need for additional procedures including hysterectomy in the event of a life-threatening hemorrhage; placental abnormalities wth subsequent pregnancies, incisional problems, thromboembolic phenomenon and other postoperative/anesthesia complications. The patient concurred with the proposed plan, giving informed written consent for the procedure.    FINDINGS:  Viable female infant in vertex presentation.  Apgars 10 and 10, weight pending.  Clear amniotic fluid.  Intact placenta, three vessel cord.  Normal uterus, fallopian tubes and ovaries bilaterally.  ANESTHESIA:    Spinal INTRAVENOUS FLUIDS: 1300 ml ESTIMATED BLOOD LOSS: 400 ml URINE OUTPUT:  135 ml SPECIMENS: Placenta sent to pathology COMPLICATIONS: None immediate  PROCEDURE IN DETAIL:  The patient received intravenous antibiotics and had sequential compression devices applied to her lower extremities while in the preoperative area.  She was then taken to the operating room where spinal anesthesia was administered and was found to be adequate. She was then placed in a dorsal supine position with a leftward tilt, and prepped and draped in a sterile manner.  A foley catheter was placed into her bladder and  attached to constant gravity, which drained clear fluid throughout.  After an adequate timeout was performed, a Pfannenstiel skin incision was made with scalpel and carried through to the underlying layer of fascia. The fascia was incised in the midline and this incision was extended bilaterally using the Mayo scissors. Kocher clamps were applied to the superior aspect of the fascial incision and the underlying rectus muscles were dissected off bluntly. A similar process was carried out on the inferior aspect of the facial incision. The rectus muscles were separated in the midline bluntly and the peritoneum was entered bluntly. An Alexis retractor was placed to aid in visualization of the uterus.  Attention was turned to the lower uterine segment where a transverse hysterotomy was made with a scalpel and extended bilaterally bluntly. The infant was successfully delivered, and cord was clamped and cut and infant was handed over to awaiting neonatology team. Uterine massage was then administered and the placenta delivered intact with three-vessel cord. The uterus was then cleared of clot and debris.  The hysterotomy was closed with 0 Vicryl in a running locked fashion, and an imbricating layer was also placed with the same stitch. Overall, excellent hemostasis was noted.   Attention was then turned to the fallopian tubes.  A Filshie clip was placed on both tubes, about 2 cm from the cornua, with care given to incorporate the underlying mesosalpinx on both sides, allowing for bilateral tubal sterilization.  The abdomen and the pelvis were cleared of all clot and debris and the Ubaldo Glassing was removed. Hemostasis was confirmed on all surfaces.  The peritoneum was reapproximated using 2-0 vicryl running stitches. The fascia was then closed using 0 Vicryl in a running fashion. The skin was closed with 4-0 vicryl. The patient tolerated the procedure well. Sponge, lap,  instrument and needle counts were correct x 2. She was  taken to the recovery room in stable condition.    Levie HeritageStinson, Jacob J, DO 11/27/2018 3:45 AM

## 2018-11-27 NOTE — Anesthesia Preprocedure Evaluation (Addendum)
Anesthesia Evaluation  Patient identified by MRN, date of birth, ID band Patient awake    Reviewed: Allergy & Precautions, NPO status , Patient's Chart, lab work & pertinent test results  History of Anesthesia Complications Negative for: history of anesthetic complications  Airway Mallampati: II  TM Distance: >3 FB Neck ROM: Full    Dental no notable dental hx.    Pulmonary neg pulmonary ROS,    Pulmonary exam normal        Cardiovascular negative cardio ROS Normal cardiovascular exam     Neuro/Psych negative neurological ROS     GI/Hepatic negative GI ROS, Neg liver ROS,   Endo/Other  diabetes, Type 2, Insulin Dependent  Renal/GU negative Renal ROS     Musculoskeletal negative musculoskeletal ROS (+)   Abdominal   Peds  Hematology  (+) anemia , Hgb 10.8   Anesthesia Other Findings Day of surgery medications reviewed with the patient.  Reproductive/Obstetrics (+) Pregnancy Hx of C/S x1                             Anesthesia Physical Anesthesia Plan  ASA: II and emergent  Anesthesia Plan: Spinal   Post-op Pain Management:    Induction:   PONV Risk Score and Plan: 3 and Treatment may vary due to age or medical condition, Ondansetron and Dexamethasone  Airway Management Planned: Natural Airway  Additional Equipment:   Intra-op Plan:   Post-operative Plan:   Informed Consent: I have reviewed the patients History and Physical, chart, labs and discussed the procedure including the risks, benefits and alternatives for the proposed anesthesia with the patient or authorized representative who has indicated his/her understanding and acceptance.     Dental advisory given  Plan Discussed with: CRNA  Anesthesia Plan Comments:        Anesthesia Quick Evaluation

## 2018-11-27 NOTE — Discharge Summary (Addendum)
error 

## 2018-11-27 NOTE — Anesthesia Postprocedure Evaluation (Signed)
Anesthesia Post Note  Patient: Lisa Blake [facility]  Procedure(s) Performed: CESAREAN SECTION (N/A )     Patient location during evaluation: PACU Anesthesia Type: Spinal Level of consciousness: awake and alert Pain management: pain level controlled Vital Signs Assessment: post-procedure vital signs reviewed and stable Respiratory status: spontaneous breathing, nonlabored ventilation and respiratory function stable Cardiovascular status: blood pressure returned to baseline and stable Postop Assessment: no apparent nausea or vomiting and spinal receding Anesthetic complications: no    Last Vitals:  Vitals:   11/27/18 0500 11/27/18 0515  BP: (!) 162/105 (!) 154/102  Pulse: 79 76  Resp: 15 20  Temp:  36.5 C  SpO2: 100% 100%    Last Pain:  Vitals:   11/27/18 0515  PainSc: 5    Pain Goal:                Epidural/Spinal Function Cutaneous sensation: Able to Wiggle Toes (11/27/18 0515), Patient able to flex knees: Yes (11/27/18 0515), Patient able to lift hips off bed: Yes (11/27/18 0515), Back pain beyond tenderness at insertion site: No (11/27/18 0515), Progressively worsening motor and/or sensory loss: No (11/27/18 0515), Bowel and/or bladder incontinence post epidural: No (11/27/18 0515)  Brennan Bailey

## 2018-11-27 NOTE — Anesthesia Procedure Notes (Signed)
Spinal  Patient location during procedure: OR Start time: 11/27/2018 2:40 AM End time: 11/27/2018 2:42 AM Staffing Anesthesiologist: Brennan Bailey, MD Performed: anesthesiologist  Preanesthetic Checklist Completed: patient identified, pre-op evaluation, timeout performed, IV checked, risks and benefits discussed and monitors and equipment checked Spinal Block Patient position: sitting Prep: DuraPrep Patient monitoring: heart rate, continuous pulse ox and blood pressure Approach: midline Location: L3-4 Injection technique: single-shot Needle Needle type: Pencan  Needle gauge: 24 G Needle length: 10 cm Assessment Sensory level: T4 Additional Notes Risks, benefits, and alternative discussed. Patient gave consent to procedure. Prepped and draped in sitting position. Clear CSF obtained after one needle pass. Positive terminal aspiration. No pain or paraesthesias with injection. Patient tolerated procedure well. Vital signs stable. Tawny Asal, MD

## 2018-11-27 NOTE — MAU Provider Note (Signed)
None      S: [name] is a 38 y.o. G3P2002 at [redacted]w[redacted]d  who presents to MAU today complaining of LOF since 0930. She states contractions started prior to arrival.  reports normal fetal movement and reports having 2 previous C/S.    O: LMP 04/09/2018 (Exact Date)  GENERAL: Well-developed, well-nourished female in no acute distress.  HEAD: Normocephalic, atraumatic.  CHEST: Normal effort of breathing, regular heart rate ABDOMEN: Soft, nontender, gravid  Cervical exam:  Dilation: Fingertip Effacement (%): 50 Cervical Position: Posterior Exam by:: Gavin Pound, CNM   Fetal Monitoring: Baseline: 150 Variability: Moderate Accelerations: None Decelerations: Variable Contractions: Palpate mild to moderate   A: SIUP at [redacted]w[redacted]d  SROM  P: -Dr. Joyice Faster informed of patient arrival and status. -Patient last ate around 8pm. -Will admit for repeat C/S.   Gavin Pound, CNM 11/27/2018 12:24 AM

## 2018-11-27 NOTE — MAU Note (Signed)
Pt states that she is leaking fluid. She states she has scheduled c-section scheduled for August 25.   Reports +FM   Denies vaginal bleeding reports pink and clear fluid.

## 2018-11-28 DIAGNOSIS — E119 Type 2 diabetes mellitus without complications: Secondary | ICD-10-CM

## 2018-11-28 LAB — GLUCOSE, CAPILLARY
Glucose-Capillary: 55 mg/dL — ABNORMAL LOW (ref 70–99)
Glucose-Capillary: 79 mg/dL (ref 70–99)
Glucose-Capillary: 132 mg/dL — ABNORMAL HIGH (ref 70–99)
Glucose-Capillary: 159 mg/dL — ABNORMAL HIGH (ref 70–99)
Glucose-Capillary: 196 mg/dL — ABNORMAL HIGH (ref 70–99)
Glucose-Capillary: 190 mg/dL — ABNORMAL HIGH (ref 70–99)

## 2018-11-28 LAB — KLEIHAUER-BETKE STAIN
# Vials RhIg: 1
Fetal Cells %: 0 %
Quantitation Fetal Hemoglobin: 0 mL

## 2018-11-28 MED ORDER — INSULIN GLARGINE 100 UNIT/ML ~~LOC~~ SOLN
10.0000 [IU] | Freq: Every day | SUBCUTANEOUS | Status: DC
  Administered 2018-11-28: 10 [IU] via SUBCUTANEOUS
  Filled 2018-11-28 (×2): qty 0.1

## 2018-11-28 MED ORDER — RHO D IMMUNE GLOBULIN 1500 UNIT/2ML IJ SOSY
300.0000 ug | PREFILLED_SYRINGE | Freq: Once | INTRAMUSCULAR | Status: AC
  Administered 2018-11-28: 300 ug via INTRAVENOUS
  Filled 2018-11-28: qty 2

## 2018-11-28 NOTE — Lactation Note (Signed)
This note was copied from a baby's chart. Lactation Consultation Note  Patient Name: Lisa Blake [id number] Date: 11/28/2018 Reason for consult: Initial assessment;Infant < 6lbs;Late-preterm 34-36.6wks;Maternal endocrine disorder;1st time breastfeeding Type of Endocrine Disorder?: Diabetes Type 1, GHTN on MgSO4  Lisa spanish speaking, offered to get interpreter, but her sister-in-law in room and Lisa prefers for her to interpret.  Lisa understands some English, LC knows some Spanish also.   Visited with P3 Lisa Blake weighing 5 lbs.0.8oz at birth. Baby 61 hrs old. Baby at 3% weight loss, and output good.  This is Lisa's 3rd baby, but first to breastfeed.    Lisa was set up with a DEBP by [facility] RN yesterday.  Reviewed importance of disassembling pump parts, washing, rinsing, and air drying parts.  Moved drying parts from side of sink, to basin on otherside of wall on counter, explaining this to sister-in-law.   Reviewed importance of STS.  Baby just finished feeding baby 30 ml of 22 cal formula.    Reviewed breast massage and hand expression, colostrum expressed easily.  Nipples erect, areola compressible, and no visible skin trauma noted.  Plan- 1- Keep baby STS as much as possible, doing breast massage and hand expression often. 2- with feeding cues, or at 3 hrs, offer breast (limit to 30 mins per Blake guidelines) 3- supplement baby with 10-20 ml EBM+/formula per Blake guidelines by slow flow bottle.   4- Pump both breasts 15 mins on initiation setting. 5- Faxed WIC referral for DEBP at discharge, Lisa interested in [facility] loaner if discharged on the weekend.  Lisa denies any questions currently.  Encouraged to ask for assistance prn.     Interventions Interventions: Breast feeding basics reviewed;Skin to skin;Breast massage;Hand express;DEBP  Lactation Tools Discussed/Used Tools: Pump;Bottle Breast pump type: Double-Electric Breast Pump WIC Program: Yes Pump Review: Setup,  frequency, and cleaning;Milk Storage Initiated by:: RN Date initiated:: 11/27/18   Consult Status Consult Status: Follow-up Date: 11/29/18 Follow-up type: In-patient    Lisa Blake 11/28/2018, 12:11 PM

## 2018-11-28 NOTE — Progress Notes (Signed)
Subjective: Postpartum Day 1: Cesarean Delivery Patient reports incisional pain and tolerating PO.    Objective: Vital signs in last 24 hours: Temp:  [97.5 F (36.4 C)-98.2 F (36.8 C)] 97.7 F (36.5 C) (07/30 0729) Pulse Rate:  [60-80] 75 (07/30 0729) Resp:  [16-20] 17 (07/30 0729) BP: (103-173)/(63-118) 113/67 (07/30 0729) SpO2:  [98 %-100 %] 100 % (07/30 0729)  Physical Exam:  General: alert, cooperative and no distress Lochia: appropriate Uterine Fundus: firm Incision: dressing is dry DVT Evaluation: No evidence of DVT seen on physical exam.  Recent Labs    11/27/18 0107 11/27/18 0629  HGB 10.8* 10.5*  HCT 35.5* 34.6*    Assessment/Plan: Status post Cesarean section. Doing well postoperatively.  Continue current care. Magnesium off at 1245 Anticipate discharge in 1-2 days No BP meds needed Decrease lantus  Florian Buff 11/28/2018, 7:54 AM

## 2018-11-28 NOTE — Progress Notes (Signed)
Patient went to bathroom with assist from family.  Complaint of dizziness on her way back to bed.  CBG= 55 Vital signs recorded. Alert and oriented. Sprite 235cc and graham crackers given. 0445 CBG =  79. Patient claims feels better.

## 2018-11-28 NOTE — Progress Notes (Addendum)
Inpatient Diabetes Program Recommendations  AACE/ADA: New Consensus Statement on Inpatient Glycemic Control (2015)  Target Ranges:  Prepandial:   less than 140 mg/dL      Peak postprandial:   less than 180 mg/dL (1-2 hours)      Critically ill patients:  140 - 180 mg/dL   Lab Results  Component Value Date   GLUCAP 132 (H) 11/28/2018   HGBA1C 6.4 (H) 06/28/2018    Review of Glycemic Control Results for ANALEI, WHINERY (MRN [id number]) as of 11/28/2018 08:55  Ref. Range 11/27/2018 22:05 11/28/2018 04:28 11/28/2018 04:46 11/28/2018 07:52  Glucose-Capillary Latest Ref Range: 70 - 99 mg/dL 269 (H) 55 (L) 79 132 (H)   Diabetes history: Type 1 DM (per H&P) Outpatient Diabetes medications: Lantus 20 units QD, Humalog 6 units TID Current orders for Inpatient glycemic control: Lantus 10 units QHS, Novolog 0-15 units TID, Novolog 0-5 units QHS BMZ x1 on 7/29  Inpatient Diabetes Program Recommendations:    Noted in chart review patient was on insulin prior to pregnancy and appears patient is type 1 DM, thus requiring basal and coverage with meals.   Hypoglycemic event this AM of 55 mg/dL.   Consider the following: -Switching Lantus to 6 units QD -Adding Novolog 2 units TID (assuming that patient is consuming >50% of meal - Changing correction to Novolog 0-9 units TID and discontinuing QHS scale.  -Additionally, will place CM consult for PCP follow up on diabetes post partum.  Thanks, Bronson Curb, MSN, RNC-OB Diabetes Coordinator [phone] (8a-5p)

## 2018-11-28 NOTE — Plan of Care (Signed)
  Problem: Activity: Goal: Ability to tolerate increased activity will improve Outcome: Progressing   Problem: Fluid Volume: Goal: Peripheral tissue perfusion will improve Outcome: Progressing   Problem: Education: Goal: Knowledge of condition will improve Outcome: Completed/Met   Problem: Activity: Goal: Will verbalize the importance of balancing activity with adequate rest periods Outcome: Completed/Met   Problem: Role Relationship: Goal: Ability to demonstrate positive interaction with newborn will improve Outcome: Completed/Met

## 2018-11-29 LAB — RH IG WORKUP (INCLUDES ABO/RH)
ABO/RH(D): A NEG
Gestational Age(Wks): 35
Unit division: 0

## 2018-11-29 LAB — GLUCOSE, CAPILLARY
Glucose-Capillary: 60 mg/dL — ABNORMAL LOW (ref 70–99)
Glucose-Capillary: 179 mg/dL — ABNORMAL HIGH (ref 70–99)
Glucose-Capillary: 211 mg/dL — ABNORMAL HIGH (ref 70–99)
Glucose-Capillary: 216 mg/dL — ABNORMAL HIGH (ref 70–99)
Glucose-Capillary: 195 mg/dL — ABNORMAL HIGH (ref 70–99)

## 2018-11-29 MED ORDER — ENALAPRIL MALEATE 10 MG PO TABS
10.0000 mg | ORAL_TABLET | Freq: Every day | ORAL | Status: DC
  Administered 2018-11-30: 08:00:00 10 mg via ORAL
  Filled 2018-11-29: qty 1

## 2018-11-29 MED ORDER — INSULIN GLARGINE 100 UNIT/ML ~~LOC~~ SOLN
6.0000 [IU] | Freq: Every day | SUBCUTANEOUS | Status: DC
  Administered 2018-11-29 – 2018-11-30 (×2): 6 [IU] via SUBCUTANEOUS
  Filled 2018-11-29 (×2): qty 0.06

## 2018-11-29 MED ORDER — INSULIN ASPART 100 UNIT/ML ~~LOC~~ SOLN
2.0000 [IU] | Freq: Three times a day (TID) | SUBCUTANEOUS | Status: DC
  Administered 2018-11-29 – 2018-11-30 (×3): 2 [IU] via SUBCUTANEOUS

## 2018-11-29 MED ORDER — ENALAPRIL MALEATE 5 MG PO TABS
5.0000 mg | ORAL_TABLET | Freq: Every day | ORAL | Status: DC
  Administered 2018-11-29: 5 mg via ORAL
  Filled 2018-11-29: qty 1

## 2018-11-29 MED ORDER — ENALAPRIL MALEATE 5 MG PO TABS: 5.0000 mg | ORAL_TABLET | Freq: Every day | ORAL | 1 refills | Status: DC

## 2018-11-29 MED FILL — ENALAPRIL MALEATE 5 MG TABS: 5 | 30 days supply | Qty: 30 | Fill #0

## 2018-11-29 NOTE — Progress Notes (Signed)
Blood sugar  Is 179 past breaksfast

## 2018-11-29 NOTE — Progress Notes (Signed)
Pt blood sugar 60  Breakfast present  Pt told to eat and will repeat  BS  Pt asymtomatic

## 2018-11-29 NOTE — Progress Notes (Signed)
Subjective: Postpartum Day 2: Cesarean Delivery with BTL d/t IUP 35 weeks, PROM, prior c section, unwanted fertility and SPEC  Pt without complaints this morning. Denies HA or visual changes. Ambulating and voiding without problems. Tolerating diet. Pain controlled  Objective: Vital signs in last 24 hours: Temp:  [97.4 F (36.3 C)-98.6 F (37 C)] 97.9 F (36.6 C) (07/31 1139) Pulse Rate:  [70-120] 120 (07/31 1139) Resp:  [16-20] 18 (07/31 1139) BP: (119-158)/(59-89) 155/82 (07/31 1139) SpO2:  [98 %-100 %] 100 % (07/31 1139) Weight:  [73.7 kg] 73.7 kg (07/30 2106)  Physical Exam:  General: alert  Lochia: appropriate Uterine Fundus: firm Incision: healing well DVT Evaluation: No evidence of DVT seen on physical exam.  Recent Labs    11/27/18 0107 11/27/18 0629  HGB 10.8* 10.5*  HCT 35.5* 34.6*    Assessment/Plan: Stable. Off magnesium. BP slight elevated, will start Vasotec. Adjust insulin regiment as per DM education's recommendation. Continue to follow CBG's. Continue with progressive care.   Chancy Milroy 11/29/2018, 12:44 PM

## 2018-11-29 NOTE — Progress Notes (Signed)
Inpatient Diabetes Program Recommendations  AACE/ADA: New Consensus Statement on Inpatient Glycemic Control (2015)  Target Ranges:  Prepandial:   less than 140 mg/dL      Peak postprandial:   less than 180 mg/dL (1-2 hours)      Critically ill patients:  140 - 180 mg/dL   Lab Results  Component Value Date   GLUCAP 179 (H) 11/29/2018   HGBA1C 6.4 (H) 06/28/2018    Review of Glycemic Control Results for LORELAI, HUYSER (MRN [id number]) as of 11/29/2018 09:37  Ref. Range 11/29/2018 09:10  Glucose-Capillary Latest Ref Range: 70 - 99 mg/dL 179 (H)   Diabetes history: Type 1 DM (per H&P) Outpatient Diabetes medications: Lantus 20 units QD, Humalog 6 units TID Current orders for Inpatient glycemic control: Lantus 10 units QHS, Novolog 0-15 units TID, Novolog 0-5 units QHS BMZ x1 on 7/29  Inpatient Diabetes Program Recommendations:    Noted in chart review patient was on insulin prior to pregnancy and appears patient is type 1 DM, thus requiring basal and coverage with meals.   Hypoglycemic event this AM of 60 mg/dL.   Consider the following: -Switching Lantus to 6 units QD -Adding Novolog 2 units TID (assuming that patient is consuming >50% of meal - Changing correction to Novolog 0-9 units TID and discontinuing QHS scale.  - carb modified diet  Thanks, Bronson Curb, MSN, RNC-OB Diabetes Coordinator [phone] (8a-5p)

## 2018-11-30 DIAGNOSIS — Z6791 Unspecified blood type, Rh negative: Secondary | ICD-10-CM

## 2018-11-30 DIAGNOSIS — O26899 Other specified pregnancy related conditions, unspecified trimester: Secondary | ICD-10-CM

## 2018-11-30 LAB — GLUCOSE, CAPILLARY
Glucose-Capillary: 137 mg/dL — ABNORMAL HIGH (ref 70–99)
Glucose-Capillary: 139 mg/dL — ABNORMAL HIGH (ref 70–99)

## 2018-11-30 MED ORDER — SIMETHICONE 80 MG PO CHEW: 80.0000 mg | CHEWABLE_TABLET | ORAL | 0 refills | Status: DC | PRN

## 2018-11-30 MED ORDER — ENALAPRIL MALEATE 10 MG PO TABS: 10.0000 mg | ORAL_TABLET | Freq: Every day | ORAL | Status: DC

## 2018-11-30 MED ORDER — INSULIN GLARGINE 100 UNIT/ML ~~LOC~~ SOLN: 9.0000 [IU] | Freq: Every day | SUBCUTANEOUS | Status: DC

## 2018-11-30 MED ORDER — INSULIN GLARGINE 100 UNIT/ML ~~LOC~~ SOLN: 9.0000 [IU] | Freq: Every day | SUBCUTANEOUS | 11 refills | Status: DC

## 2018-11-30 MED ORDER — IBUPROFEN 600 MG PO TABS: 600.0000 mg | ORAL_TABLET | Freq: Four times a day (QID) | ORAL | 0 refills | Status: DC | PRN

## 2018-11-30 MED ORDER — OXYCODONE-ACETAMINOPHEN 5-325 MG PO TABS: 1.0000 | ORAL_TABLET | ORAL | 0 refills | Status: DC | PRN

## 2018-11-30 MED ORDER — ENALAPRIL MALEATE 5 MG PO TABS: 10.0000 mg | ORAL_TABLET | Freq: Every day | ORAL | 1 refills | Status: DC

## 2018-11-30 MED ORDER — INSULIN LISPRO 100 UNIT/ML ~~LOC~~ SOLN: 4.0000 [IU] | Freq: Three times a day (TID) | SUBCUTANEOUS | 11 refills | Status: DC

## 2018-11-30 MED ORDER — POLYETHYLENE GLYCOL 3350 17 G PO PACK: 17.0000 g | PACK | Freq: Every day | ORAL | 0 refills | Status: DC

## 2018-11-30 MED ORDER — INSULIN ASPART 100 UNIT/ML ~~LOC~~ SOLN
4.0000 [IU] | Freq: Three times a day (TID) | SUBCUTANEOUS | Status: DC
  Administered 2018-11-30: 13:00:00 4 [IU] via SUBCUTANEOUS

## 2018-11-30 MED ORDER — FERROUS SULFATE 325 (65 FE) MG PO TABS: 325.0000 mg | ORAL_TABLET | Freq: Every day | ORAL | 0 refills | Status: DC

## 2018-11-30 NOTE — Progress Notes (Signed)
error 

## 2018-11-30 NOTE — Lactation Note (Signed)
This note was copied from a baby's chart. Lactation Consultation Note  Patient Name: Lisa Blake [id number] Date: 11/30/2018   Late preterm infant is 78 hrs old & noted to have gained 40g over the last day. Mom noted to be on enalapril 10mg  (L2).  This LC entered room to do consult, but noted that room was quiet & dark. Lactation to f/u later today.  Matthias Hughs [facility] 11/30/2018, 8:58 AM

## 2018-11-30 NOTE — Lactation Note (Signed)
This note was copied from a baby's chart. Lactation Consultation Note  Patient Name: Lisa Blake UNHRV'[id number] Date: 11/30/2018 Reason for consult: Follow-up assessment;Maternal endocrine disorder;Other (Comment);Late-preterm 34-36.6wks;Infant < 6lbs(AMA) Type of Endocrine Disorder?: Diabetes(DM2)  77 hours old LPI < 6 lbs female who is being partially BF and formula fed by her mother, she's a P3. Mom and baby are going home today, he's at 3% weight loss. Mom's breasts are started to fill in, she's been pumping after feedings, encouraged her to keep doing so and praised her for her efforts.   Mom was offered a [facility] loaner but she told LC that she didn't have any cash to pay for the pump; all she has is her credit card. LC told her about the ATM in the building but mom chooses to use her DEBP kit as a double hand pump instead until Monday when she can get her pump through [facility]. She said she may come back later with the 30 dollar cash to do the [facility] loaner, but she's not sure.  She had a few questions for her RN, paged her and front desk let lactation know that all her questions will be addressed when they do mom's discharge; she's a Spanish speaker. Reviewed discharge instructions, engorgement prevention and treatment, treatment and prevention for sore nipples. Mom understands that baby won't be able to fully empty the breast at this point and that pumping will be crucial for BF success, mom said this is her first time BF.  Mom reported all questions and concerns were answered, she's aware of [facility] OP services and will contact if needed.    Maternal Data    Feeding    LATCH Score                   Interventions Interventions: Breast feeding basics reviewed;DEBP  Lactation Tools Discussed/Used Tools: Pump Breast pump type: Double-Electric Breast Pump(showed her how to use it as a double hand pump)   Consult Status Consult Status: Complete Date: 11/30/18 Follow-up type: Call  as needed    [place] 11/30/2018, 11:34 AM

## 2018-11-30 NOTE — Discharge Instructions (Signed)
Hipertensin posparto Postpartum Hypertension La hipertensin posparto es el aumento de la presin arterial que se mantiene ms alta de lo normal despus del parto. Puede no darse cuenta de que tiene hipertensin posparto si no le miden la presin arterial con regularidad. En la [employer], la hipertensin posparto desaparece sola, por lo general en la semana posterior al parto. Sin embargo, algunas mujeres requieren tratamiento mdico para prevenir complicaciones graves, como convulsiones o un accidente cerebrovascular. Cules son las causas? Esta afeccin puede ser causada por uno o ms de lo siguiente:  Hipertensin que exista antes del embarazo (hipertensin crnica).  Hipertensin que surge como resultado del embarazo (hipertensin gestacional).  Trastornos hipertensivos Solicitor (preeclampsia) o convulsiones en las mujeres que tienen hipertensin arterial durante el embarazo (eclampsia).  Una afeccin en la que el hgado, las plaquetas y los glbulos rojos se daan durante el embarazo (sndrome de HELLP).  Una afeccin en la cual la glndula tiroidea produce demasiadas hormonas (hipertiroidismo).  Otros problemas poco frecuentes de los nervios (trastornos neurolgicos) o trastornos de Herbalist. En algunos casos, es posible que la causa se desconozca. Qu incrementa el riesgo? Los siguientes factores pueden hacer que usted sea ms propensa a tener esta afeccin:  Hipertensin crnica. En algunos casos, es posible que esta no se haya diagnosticado antes del embarazo.  Obesidad.  Diabetes tipo 2.  Enfermedad renal.  Antecedentes de preeclampsia o eclampsia.  Otras afecciones mdicas que modifican el nivel de hormonas en el cuerpo (desequilibrio hormonal). Cules son los signos o los sntomas? Al igual que con todos los tipos de hipertensin, la hipertensin posparto puede no causar ningn sntoma. Segn lo alta que est la presin arterial, puede  presentar lo siguiente:  Dolores de [place]. Estos pueden ser leves, moderados o intensos. Tambin pueden ser regulares, constantes o de inicio repentino (cefalea en estallido).  Cambios en la capacidad para ver (cambios visuales).  Mareos.  Falta de aire.  Hinchazn de Marriott, los pies, la parte inferior de las piernas o el rostro. En algunos casos, puede tener hinchazn en varias de estas reas.  Palpitaciones o latidos cardacos acelerados.  Dificultad para respirar al Cleora Fleet.  Disminucin en la cantidad de orina que elimina. Otros signos y sntomas poco frecuentes pueden incluir lo siguiente:  Ms sudoracin que la habitual. Esta dura algo ms que [employer] Stores del parto.  Dolor en el pecho.  Mareos repentinos al levantarse despus de haber estado sentada o [place].  Convulsiones.  Nuseas o vmitos.  Dolor abdominal. Cmo se diagnostica? Esta afeccin se puede diagnosticar en funcin de los resultados de un examen fsico, mediciones de la presin arteria, y [place] de [place] y [place]. Tambin puede someterse a otras pruebas, como una exploracin por tomografa computarizada (TC) o una resonancia magntica (RM) para Hydrographic surveyor otros problemas de la hipertensin posparto. Cmo se trata? Si la presin arterial est lo suficientemente alta como para requerir tratamiento, las opciones pueden incluir lo siguiente:  Medicamentos para disminuir la presin arterial (antihipertensivos). Dgale al mdico si est amamantando o si planifica hacerlo. Hay muchos medicamentos antihipertensivos que pueden tomarse sin riesgos durante la Transport planner.  Interrupcin de los [employer] Corporation puedan causar la hipertensin.  Tratamiento de las enfermedades que causan la hipertensin.  Tratamiento de las complicaciones de la hipertensin, como convulsiones, accidente cerebrovascular o problemas renales. El mdico seguir controlando atentamente la presin arterial hasta que esta se  encuentre en un nivel seguro para usted. Siga estas indicaciones en su  casa:  Tome los medicamentos de venta libre y los recetados solamente como se lo haya indicado el mdico.  Retome sus actividades normales como se lo haya indicado el mdico. Pregntele al mdico qu actividades son seguras para usted.  No consuma ningn producto que contenga nicotina o tabaco, como cigarrillos y Psychologist, sport and exercise. Si necesita ayuda para dejar de fumar, consulte al mdico.  Concurra a todas las visitas de control como se lo haya indicado el mdico. Esto es importante. Comunquese con un mdico si:  Los sntomas empeoran.  Aparecen nuevos sntomas, por ejemplo: ? Un dolor de cabeza que no mejora. ? Mareos. ? Cambios en la visin. Solicite ayuda de inmediato si:  Presenta hinchazn repentina IAC/InterActiveCorp, los tobillos o el rostro.  Tiene un aumento de peso repentino y rpido.  Tiene dificultad para respirar, dolor en el pecho, latidos cardacos acelerados o palpitaciones cardacas.  Siente dolor intenso en el abdomen.  Tiene sntomas de un accidente cerebrovascular. "BE FAST" es una manera fcil de recordar las principales seales de advertencia de un accidente cerebrovascular: ? B - Balance (equilibrio). Los signos son dificultad repentina para caminar o prdida del equilibrio. ? E - Eyes (ojos). Los signos son dificultad para ver o un cambio repentino en la visin. ? F - Face (rostro). Los signos son debilidad repentina o entumecimiento del rostro, o el rostro o el prpado que se caen hacia un lado. ? A - Arms (brazos). Los signos son debilidad o entumecimiento en un brazo. Esto sucede de repente y generalmente en un lado del cuerpo. ? S - Speech (habla). Los signos son dificultad para hablar, hablar arrastrando las palabras o dificultad para comprender lo que la gente dice. ? T - Time (tiempo). Es tiempo de Solicitor a los servicios de Multimedia programmer. Escriba la hora en la que comenzaron los  sntomas.  Presenta otros signos de accidente cerebrovascular, como los siguientes: ? Dolor de cabeza sbito e intenso que no tiene causa aparente. ? Nuseas o vmitos. ? Convulsiones. Estos sntomas pueden representar un problema grave que constituye Engineer, maintenance (IT). No espere hasta que los sntomas desaparezcan. Solicite atencin mdica de inmediato. Comunquese con el servicio de emergencias de su localidad (911 en los [place]). No conduzca por sus propios medios Goldman Sachs hospital. Resumen  La hipertensin posparto es el aumento de la presin arterial que se mantiene ms alta de lo normal despus del [place].  En la [employer], la hipertensin posparto desaparece sola, por lo general en la semana posterior al parto.  Algunas mujeres requieren tratamiento mdico para prevenir complicaciones graves, como convulsiones o un accidente cerebrovascular. Esta informacin no tiene Marine scientist el consejo del mdico. Asegrese de hacerle al mdico cualquier pregunta que tenga. Document Released: 01/30/2014 Document Revised: 07/28/2017 Document Reviewed: 04/08/2017 Elsevier Patient Education  [place] por Essie Christine, cuidados posteriores Cesarean Delivery, Care After Target Corporation brinda informacin sobre cmo cuidarse despus del procedimiento. El mdico tambin podr darle indicaciones ms especficas. Comunquese con el mdico si tiene problemas o preguntas. Qu puedo esperar despus del procedimiento? Despus del procedimiento, es comn Abbott Laboratories siguientes sntomas:  Una pequea cantidad de sangre o de lquido transparente que proviene de la incisin.  Enrojecimiento, hinchazn y dolor en la zona de la incisin.  Dolor y [place] abdominales.  Hemorragia vaginal (loquios). Aunque no haya tenido parto vaginal, tendr sangrado y secrecin vaginal.  Calambres plvicos.  Fatiga. Es posible que Corporate treasurer, hinchazn y Silver[place] en  el tejido que se  encuentra entre la vagina y el ano (perineo) en los siguientes casos:  Si la cesrea no fue planificada y Consulting civil engineerle permitieron realizar el Edesvilletrabajo de parto y pujar.  Si le hicieron una incisin en la zona (episiotoma) o el tejido se desgarr durante el intento de parto vaginal. Siga estas indicaciones en su casa: Cuidados de la incisin   Siga las indicaciones del mdico acerca del cuidado de la incisin. Asegrese de hacer lo siguiente: ? Lvese las manos con agua y jabn antes de Multimedia programmercambiar las vendas (vendaje). Use desinfectante para manos si no dispone de [place] y [place]jabn. ? Si tiene un vendaje, cmbielo o quteselo siguiendo las indicaciones del mdico. ? No retire los puntos (suturas), las grapas cutneas, la goma para cerrar la piel o las tiras Canovaadhesivas. Es posible que estos cierres cutneos Conservation officer, naturedeban permanecer en la piel durante 2semanas o ms tiempo. Si los bordes de las tiras [address] Rdadhesivas empiezan a despegarse y Scientific laboratory technicianenroscarse, puede recortar los que estn sueltos. No retire las tiras Agilent Technologiesadhesivas por completo a menos que el mdico se lo indique.  Controle todos los das la zona de la incisin para detectar signos de infeccin. Est atento a los siguientes signos: ? Aumento del enrojecimiento, la hinchazn o Chief Technology Officerel dolor. ? Ms lquido Ar[place] Janskyo sangre. ? Calor. ? Pus o mal olor.  No tome baos de inmersin, no nade ni use un jacuzzi hasta que el mdico la autorice. Pregntele al mdico si puede ducharse.  Cuando tosa o estornude, abrace Rockwell Automationuna almohada. Esto ayuda con el dolor y Nordstromdisminuye la posibilidad de que su incisin se abra (dehiscencia). Haga esto hasta que la incisin cicatrice. Medicamentos  Baxter Internationalome los medicamentos de venta libre y los recetados solamente como se lo haya indicado el mdico.  Si le recetaron un antibitico, tmelo como se lo haya indicado el mdico. No deje de tomar el antibitico aunque comience a sentirse mejor.  No conduzca ni use maquinaria pesada mientras toma analgsicos  recetados. Estilo de vida  No beba alcohol. Esto es de suma importancia si est amamantando o toma analgsicos.  No consuma ningn producto que contenga nicotina o tabaco, como cigarrillos, cigarrillos electrnicos y tabaco de Theatre managermascar. Si necesita ayuda para dejar de fumar, consulte al American Expressmdico. Comida y bebida  Beba al menos 8vasos de ochoonzas (240cc) de agua todos los [address]  [address] a menos que el mdico le indique lo contrario. Si amamanta, quiz deba beber an ms cantidad de agua.  Coma alimentos ricos en Enbridge Energyfibras todos los das. Estos alimentos pueden ayudar a prevenir o Educational psychologistaliviar el estreimiento. Los alimentos ricos en fibra incluyen los siguientes: ? Panes y cereales integrales. ? Arroz integral. ? Armed forces operational officerrijoles. ? Nils PyleFrutas y verduras frescas. Actividad   Si es posible, pdale a alguien que la ayude a Scientist, product/process developmentcuidar del beb y con las tareas del hogar durante al menos Time Warneralgunos das despus de que le den el alta del hospital.  Retome sus actividades normales como se lo haya indicado el mdico. Pregntele al mdico qu actividades son seguras para usted.  Descanse todo lo que pueda. Trate de descansar o tomar una siesta mientras el beb duerme.  No levante ningn objeto que pese ms de 10libras (4,5kg) o el lmite de peso que le hayan indicado, hasta que el mdico le diga que puede [place] Berlinhacerlo.  Hable con el mdico sobre cundo puede retomar la actividad sexual. Esto puede depender de lo siguiente: ? Riesgo de sufrir una infeccin. ? La rapidez con que cicatrice. ? Comodidad  y deseo de retomar la actividad sexual. Indicaciones generales  No use tampones ni se haga duchas vaginales hasta que el mdico la autorice.  Use ropa floja y cmoda y un sostn firme y Dietitianque le calce bien.  Mantenga el perineo limpio y seco. Cuando vaya al bao, siempre higiencese de adelante hacia atrs.  Si expulsa un cogulo de sangre, gurdelo y llame al mdico para informrselo. No deseche los cogulos de sangre por el inodoro  antes de recibir indicaciones del mdico.  OceanographerConcurra a todas las visitas de seguimiento para usted y Dance movement psychotherapistel beb, como se lo haya indicado el mdico. Esto es importante. Comunquese con un mdico si:  Tiene los siguientes sntomas: ? Lib[place] Arab JamahiriyaFiebre. ? Secrecin vaginal con mal olor. ? Pus o mal olor en Immunologistel lugar de la incisin. ? Dificultad o dolor al ConocoPhillipsorinar. ? Aumento o disminucin repentinos de la frecuencia de las deposiciones. ? Aumento del enrojecimiento, la hinchazn o el dolor alrededor de la incisin. ? Aumento del lquido o sangre proveniente de la incisin. ? Erupcin cutnea. ? Nuseas. ? Poco inters o falta de inters en actividades que solan gustarle. ? Dudas sobre su cuidado y el del beb.  Su incisin se siente caliente al tacto.  Siente dolor en las mamas y se ponen rojas o duras.  Siente tristeza o preocupacin de forma inusual.  Vomita.  Elimina un cogulo de sangre grande por la vagina.  Orina ms de lo habitual.  Se siente mareado o aturdido. Solicite ayuda inmediatamente si:  Tiene los siguientes sntomas: ? Dolor que no desaparece o no mejora con medicamentos. ? Journalist, newspaperDolor en el pecho. ? Dificultad para respirar. ? Visin borrosa o manchas en la visin. ? Pensamientos de autolesionarse o lesionar al beb. ? Air cabin crewuevo dolor en el abdomen o en una de las piernas. ? Dolor de cabeza intenso.  Se desmaya.  Tiene una hemorragia tan intensa en la vagina que empapa ms de un apsito en Marshall & Ilsleyuna hora. El sangrado no debe ser ms abundante que el perodo ms intenso que haya tenido. Resumen  Despus del procedimiento, es comn Glass blower/designertener dolor en el lugar de la incisin, clicos abdominales, y sangrado vaginal leve.  Controle todos los das la zona de la incisin para detectar signos de infeccin.  Informe al mdico sobre cualquier sntoma inusual.  Concurra a todas las visitas de seguimiento para usted y el beb, como se lo haya indicado el mdico. Esta informacin no tiene Public house managercomo  fin reemplazar el consejo del mdico. Asegrese de hacerle al mdico cualquier pregunta que tenga. Document Released: 04/17/2005 Document Revised: 11/28/2017 Document Reviewed: 11/28/2017 Elsevier Patient Education  2020 Arvin[employer]itorElsevier Inc[employer]

## 2018-12-03 NOTE — Discharge Summary (Signed)
Postpartum Discharge Summary     Patient Name: Lisa Blake DOB: [birth year] MRN: [id number]  Date of admission: 11/26/2018 Delivering Provider: Truett Mainland   Date of discharge: 11/30/2018  Admitting diagnosis: PPROM Gestational age at admission: [redacted]w[redacted]d     Secondary diagnosis:  Active Problems:   Severe pre-eclampsia (BPs) Preexisting diabetes complicating pregnancy, antepartum   AMA (advanced maternal age) multigravida 35+   Language barrier, speaks Spanish only   History of cesarean delivery   Unwanted fertility   Sickle cell trait ([place])   Type 2 diabetes mellitus ([place])   Rh negative state in antepartum period  Additional problems: none     Discharge diagnosis: Preterm Pregnancy Delivered and Type 2 DM                                                                                                Post partum procedures:none  Augmentation: n/a  Complications: None  Hospital course:  Onset of Labor With Unplanned C/S  38 y.o. yo [id number] at [redacted]w[redacted]d was admitted in Latent Labor on 11/26/2018. Patient had a labor course significant for PPROM. Membrane Rupture Time/Date: 9:30 PM ,11/26/2018   The patient went for cesarean section due to prior cesarean delivery, PPROM, and delivered a Viable infant,11/27/2018  Details of operation can be found in separate operative note.  She had 24 hours of PP Mg.  Her insulin regimen and BP regimen were adjusted. Patient strongly desired discharge to home and she stated that she would be vigilant in watching her BPs and CBGs at home.   She is ambulating,tolerating a regular diet, passing flatus, and urinating well.  Patient is discharged home in stable condition 11/30/18.  Magnesium Sulfate recieved: Yes BMZ received: Yes  Physical exam  Vitals:   11/30/18 0500 11/30/18 0815 11/30/18 1215 11/30/18 1355  BP: (!) 156/78 (!) 147/86 (!) 156/81 (!) 159/78  Pulse: 72 76 98   Resp: 18 16 18 16   Temp: 98.3 F (36.8 C) 97.7 F (36.5  C) 98.4 F (36.9 C)   TempSrc: Oral Oral Oral   SpO2: 98% 100% 100%   Weight:      Height:       General: alert Lochia: appropriate Uterine Fundus: soft, nttp Incision: c/d/i dressing DVT Evaluation: No evidence of DVT seen on physical exam. Labs: Lab Results  Component Value Date   WBC 17.8 (H) 11/27/2018   HGB 10.5 (L) 11/27/2018   HCT 34.6 (L) 11/27/2018   MCV 70.6 (L) 11/27/2018   PLT 181 11/27/2018   CMP Latest Ref Rng & Units 11/27/2018  Glucose 70 - 99 mg/dL 159(H)  BUN 6 - 20 mg/dL 9  Creatinine 0.44 - 1.00 mg/dL 0.52  Sodium 135 - 145 mmol/L 137  Potassium 3.5 - 5.1 mmol/L 3.8  Chloride 98 - 111 mmol/L 109  CO2 22 - 32 mmol/L 22  Calcium 8.9 - 10.3 mg/dL 8.4(L)  Total Protein 6.5 - 8.1 g/dL 6.3(L)  Total Bilirubin 0.3 - 1.2 mg/dL 0.4  Alkaline Phos 38 - 126 U/L 109  AST 15 - 41 U/L 26  ALT 0 - 44 U/L 24    Discharge instruction: per After Visit Summary and "Baby and Me Booklet".  After visit meds:  Allergies as of 11/30/2018      Reactions   Penicillins    Did it involve swelling of the face/tongue/throat, SOB, or low BP? Unknown Did it involve sudden or severe rash/hives, skin peeling, or any reaction on the inside of your mouth or nose? Unknown Did you need to seek medical attention at a hospital or doctor's office? Unknown When did it last happen?childhood If all above answers are "NO", may proceed with cephalosporin use.      Medication List    STOP taking these medications   aspirin EC 81 MG tablet     TAKE these medications   Accu-Chek FastClix Lancets Misc 1 Device by Percutaneous route 4 (four) times daily.   AMBULATORY NON FORMULARY MEDICATION 1 Device by Other route once a week. Blood pressure cuff medium size. 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   enalapril 5 MG tablet Commonly known as: Vasotec Take 2 tablets (10 mg total) by mouth daily.    enalapril 10 MG tablet Commonly known as: VASOTEC Take 1 tablet (10 mg total) by mouth daily. Start taking on: December 01, 2018   ferrous sulfate 325 (65 FE) MG tablet Commonly known as: FerrouSul Take 1 tablet (325 mg total) by mouth daily with breakfast. What changed: when to take this   glucose blood test strip Commonly known as: Accu-Chek Guide Use as instructed QID   ibuprofen 600 MG tablet Commonly known as: ADVIL Take 1 tablet (600 mg total) by mouth every 6 (six) hours as needed.   insulin glargine 100 UNIT/ML injection Commonly known as: LANTUS Inject 0.09 mLs (9 Units total) into the skin daily. What changed: how much to take   insulin lispro 100 UNIT/ML injection Commonly known as: HumaLOG Inject 0.04 mLs (4 Units total) into the skin 3 (three) times daily with meals. What changed: how much to take   INSULIN SYRINGE .5CC/30GX5/16" 30G X 5/16" 0.5 ML Misc 1 Device by Does not apply route as directed.   oxyCODONE-acetaminophen 5-325 MG tablet Commonly known as: PERCOCET/ROXICET Take 1-2 tablets by mouth every 4 (four) hours as needed for moderate pain.   pantoprazole 20 MG tablet Commonly known as: Protonix Take 1 tablet (20 mg total) by mouth daily.   polyethylene glycol 17 g packet Commonly known as: MIRALAX / GLYCOLAX Take 17 g by mouth daily.   PrePLUS 27-1 MG Tabs Take 1 tablet by mouth daily.   simethicone 80 MG chewable tablet Commonly known as: MYLICON Chew 1 tablet (80 mg total) by mouth as needed for flatulence.       Diet: carb modified diet  Activity: Advance as tolerated. Pelvic rest for 6 weeks.   Outpatient follow up:1wk BP and CBG check Follow up Appt: Future Appointments  Date Time Provider Department Center  12/05/2018 10:00 AM [facility] NURSE [facility]  12/25/2018  8:55 AM Magnus SinningWenzel, Dimas AlexandriaJulie N, PA-C [facility]   Follow up Visit: Follow-up Information    [facility] Go in 5 day(s).   Specialty:  Obstetrics and Gynecology Contact information: [address], [address] [id number] [place] [phone]           Please schedule this patient for Postpartum visit in: 1 week with  the following provider: RN For C/S patients schedule nurse incision check in weeks 2 weeks: yes High risk pregnancy complicated by: DM2 Delivery mode:  CS Anticipated Birth Control:  BTL done PP Procedures needed: BP check  Schedule Integrated BH visit: no      Newborn Data: Live born female  Birth Weight:   APGAR: 10, 10  Newborn Delivery   Birth date/time: 11/27/2018 03:08:00 Delivery type: C-Section, Low Transverse Trial of labor: No C-section categorization: Repeat      Baby Feeding: Breast Disposition:home with mother   11/30/2018 Graham Bingharlie Mohammad Granade, MD

## 2018-12-04 ENCOUNTER — Other Ambulatory Visit: Payer: Medicaid Other

## 2018-12-05 ENCOUNTER — Ambulatory Visit (INDEPENDENT_AMBULATORY_CARE_PROVIDER_SITE_OTHER): Payer: Medicaid Other

## 2018-12-05 ENCOUNTER — Other Ambulatory Visit: Payer: Self-pay

## 2018-12-05 VITALS — BP 140/92 | HR 92 | Wt 155.7 lb

## 2018-12-05 DIAGNOSIS — Z5189 Encounter for other specified aftercare: Secondary | ICD-10-CM

## 2018-12-05 NOTE — Patient Instructions (Signed)
Colace 100 mg tablet two times a day over the counter

## 2018-12-05 NOTE — Progress Notes (Signed)
Pt here today for wound check and BP check s/p c-section on 11/27/18.  With Spanish Interpreter Raquel M., pt reports that she is having no pain but some scant bleeding that she wears a panty liner for.  Incision well approximated, no odor, and scant drainage on left side of incision.  Removed steri-strips, cleansed with NS, and replaced with three steri strips on incision to continue to heal.  I advised pt to allow warm, soapy water to run over incision and pat dry.  Pt advised to continue to monitor for infection.  Pt verbalized understanding.  BP taken in [place] 140/92.  Pt reports taking Vasotec 10 mg po daily.  Pt denies any sx's of HTN.  Notiifed Dr. Roselie Awkward pt's BP- no new orders to continue with Vasotec 10 mg po tablet as prescribed.  Notified pt provider's recommendation, to continue to monitor for sx's HTN, and that she will be evaluated at her pp visit.  Pt stated understanding with no further questions.   Mel Almond, RN 12/05/18

## 2018-12-09 NOTE — Progress Notes (Signed)
Patient ID: Lisa Blake, female   DOB: 1980-10-14, 38 y.o.   MRN: [id number] Patient seen and assessed by nursing staff during this encounter. I have reviewed the chart and agree with the documentation and plan.  Emeterio Reeve, MD 12/09/2018 3:23 PM

## 2018-12-11 ENCOUNTER — Other Ambulatory Visit: Payer: Medicaid Other

## 2018-12-11 ENCOUNTER — Encounter: Payer: Medicaid Other | Admitting: Obstetrics & Gynecology

## 2018-12-13 ENCOUNTER — Encounter (HOSPITAL_COMMUNITY): Payer: Self-pay

## 2018-12-13 ENCOUNTER — Ambulatory Visit (HOSPITAL_COMMUNITY): Payer: Medicaid Other

## 2018-12-18 ENCOUNTER — Other Ambulatory Visit: Payer: Medicaid Other

## 2018-12-24 ENCOUNTER — Telehealth: Payer: Self-pay | Admitting: Medical

## 2018-12-24 ENCOUNTER — Inpatient Hospital Stay (HOSPITAL_COMMUNITY): Admit: 2018-12-24 | Payer: Medicaid Other | Admitting: Family Medicine

## 2018-12-24 NOTE — Telephone Encounter (Signed)
Spanish interpreter Eda spoke with patient about her appointment on 8/26 @ 8:55. Patient was instructed that the appointment is a virtual visit. Patient verbalized she has the app downloaded

## 2018-12-25 ENCOUNTER — Encounter: Payer: Medicaid Other | Admitting: Family Medicine

## 2018-12-25 ENCOUNTER — Other Ambulatory Visit: Payer: Medicaid Other

## 2018-12-25 ENCOUNTER — Ambulatory Visit (INDEPENDENT_AMBULATORY_CARE_PROVIDER_SITE_OTHER): Payer: Medicaid Other | Admitting: Medical

## 2018-12-25 ENCOUNTER — Encounter: Payer: Self-pay | Admitting: Medical

## 2018-12-25 ENCOUNTER — Telehealth: Payer: Self-pay

## 2018-12-25 ENCOUNTER — Other Ambulatory Visit: Payer: Self-pay

## 2018-12-25 DIAGNOSIS — E119 Type 2 diabetes mellitus without complications: Secondary | ICD-10-CM

## 2018-12-25 DIAGNOSIS — Z789 Other specified health status: Secondary | ICD-10-CM

## 2018-12-25 DIAGNOSIS — Z98891 History of uterine scar from previous surgery: Secondary | ICD-10-CM

## 2018-12-25 DIAGNOSIS — Z1389 Encounter for screening for other disorder: Secondary | ICD-10-CM | POA: Diagnosis not present

## 2018-12-25 DIAGNOSIS — O24011 Pre-existing diabetes mellitus, type 1, in pregnancy, first trimester: Secondary | ICD-10-CM

## 2018-12-25 DIAGNOSIS — Z3009 Encounter for other general counseling and advice on contraception: Secondary | ICD-10-CM

## 2018-12-25 NOTE — Patient Instructions (Signed)
Parto por cesrea, cuidados posteriores Cesarean Delivery, Care After Esta hoja le brinda informacin sobre cmo cuidarse despus del procedimiento. El mdico tambin podr darle indicaciones ms especficas. Comunquese con el mdico si tiene problemas o preguntas. Qu puedo esperar despus del procedimiento? Despus del procedimiento, es comn tener los siguientes sntomas:  Una pequea cantidad de sangre o de lquido transparente que proviene de la incisin.  Enrojecimiento, hinchazn y dolor en la zona de la incisin.  Dolor y molestia abdominales.  Hemorragia vaginal (loquios). Aunque no haya tenido parto vaginal, tendr sangrado y secrecin vaginal.  Calambres plvicos.  Fatiga. Es posible que sienta dolor, hinchazn y molestias en el tejido que se encuentra entre la vagina y el ano (perineo) en los siguientes casos:  Si la cesrea no fue planificada y le permitieron realizar el trabajo de parto y pujar.  Si le hicieron una incisin en la zona (episiotoma) o el tejido se desgarr durante el intento de parto vaginal. Siga estas indicaciones en su casa: Cuidados de la incisin   Siga las indicaciones del mdico acerca del cuidado de la incisin. Asegrese de hacer lo siguiente: ? Lvese las manos con agua y jabn antes de cambiar las vendas (vendaje). Use desinfectante para manos si no dispone de agua y jabn. ? Si tiene un vendaje, cmbielo o quteselo siguiendo las indicaciones del mdico. ? No retire los puntos (suturas), las grapas cutneas, la goma para cerrar la piel o las tiras adhesivas. Es posible que estos cierres cutneos deban permanecer en la piel durante 2semanas o ms tiempo. Si los bordes de las tiras adhesivas empiezan a despegarse y enroscarse, puede recortar los que estn sueltos. No retire las tiras adhesivas por completo a menos que el mdico se lo indique.  Controle todos los das la zona de la incisin para detectar signos de infeccin. Est atento a los  siguientes signos: ? Aumento del enrojecimiento, la hinchazn o el dolor. ? Ms lquido o sangre. ? Calor. ? Pus o mal olor.  No tome baos de inmersin, no nade ni use un jacuzzi hasta que el mdico la autorice. Pregntele al mdico si puede ducharse.  Cuando tosa o estornude, abrace una almohada. Esto ayuda con el dolor y disminuye la posibilidad de que su incisin se abra (dehiscencia). Haga esto hasta que la incisin cicatrice. Medicamentos  Tome los medicamentos de venta libre y los recetados solamente como se lo haya indicado el mdico.  Si le recetaron un antibitico, tmelo como se lo haya indicado el mdico. No deje de tomar el antibitico aunque comience a sentirse mejor.  No conduzca ni use maquinaria pesada mientras toma analgsicos recetados. Estilo de vida  No beba alcohol. Esto es de suma importancia si est amamantando o toma analgsicos.  No consuma ningn producto que contenga nicotina o tabaco, como cigarrillos, cigarrillos electrnicos y tabaco de mascar. Si necesita ayuda para dejar de fumar, consulte al mdico. Comida y bebida  Beba al menos 8vasos de ochoonzas (240cc) de agua todos los das a menos que el mdico le indique lo contrario. Si amamanta, quiz deba beber an ms cantidad de agua.  Coma alimentos ricos en fibras todos los das. Estos alimentos pueden ayudar a prevenir o aliviar el estreimiento. Los alimentos ricos en fibra incluyen los siguientes: ? Panes y cereales integrales. ? Arroz integral. ? Frijoles. ? Frutas y verduras frescas. Actividad   Si es posible, pdale a alguien que la ayude a cuidar del beb y con las tareas del hogar durante al   menos algunos das despus de que le den el alta del hospital.  Retome sus actividades normales como se lo haya indicado el mdico. Pregntele al mdico qu actividades son seguras para usted.  Descanse todo lo que pueda. Trate de descansar o tomar una siesta mientras el beb duerme.  No levante  ningn objeto que pese ms de 10libras (4,5kg) o el lmite de peso que le hayan indicado, hasta que el mdico le diga que puede hacerlo.  Hable con el mdico sobre cundo puede retomar la actividad sexual. Esto puede depender de lo siguiente: ? Riesgo de sufrir una infeccin. ? La rapidez con que cicatrice. ? Comodidad y deseo de retomar la actividad sexual. Indicaciones generales  No use tampones ni se haga duchas vaginales hasta que el mdico la autorice.  Use ropa floja y cmoda y un sostn firme y que le calce bien.  Mantenga el perineo limpio y seco. Cuando vaya al bao, siempre higiencese de adelante hacia atrs.  Si expulsa un cogulo de sangre, gurdelo y llame al mdico para informrselo. No deseche los cogulos de sangre por el inodoro antes de recibir indicaciones del mdico.  Concurra a todas las visitas de seguimiento para usted y el beb, como se lo haya indicado el mdico. Esto es importante. Comunquese con un mdico si:  Tiene los siguientes sntomas: ? Fiebre. ? Secrecin vaginal con mal olor. ? Pus o mal olor en el lugar de la incisin. ? Dificultad o dolor al orinar. ? Aumento o disminucin repentinos de la frecuencia de las deposiciones. ? Aumento del enrojecimiento, la hinchazn o el dolor alrededor de la incisin. ? Aumento del lquido o sangre proveniente de la incisin. ? Erupcin cutnea. ? Nuseas. ? Poco inters o falta de inters en actividades que solan gustarle. ? Dudas sobre su cuidado y el del beb.  Su incisin se siente caliente al tacto.  Siente dolor en las mamas y se ponen rojas o duras.  Siente tristeza o preocupacin de forma inusual.  Vomita.  Elimina un cogulo de sangre grande por la vagina.  Orina ms de lo habitual.  Se siente mareado o aturdido. Solicite ayuda inmediatamente si:  Tiene los siguientes sntomas: ? Dolor que no desaparece o no mejora con medicamentos. ? Dolor en el pecho. ? Dificultad para respirar. ?  Visin borrosa o manchas en la visin. ? Pensamientos de autolesionarse o lesionar al beb. ? Nuevo dolor en el abdomen o en una de las piernas. ? Dolor de cabeza intenso.  Se desmaya.  Tiene una hemorragia tan intensa en la vagina que empapa ms de un apsito en una hora. El sangrado no debe ser ms abundante que el perodo ms intenso que haya tenido. Resumen  Despus del procedimiento, es comn tener dolor en el lugar de la incisin, clicos abdominales, y sangrado vaginal leve.  Controle todos los das la zona de la incisin para detectar signos de infeccin.  Informe al mdico sobre cualquier sntoma inusual.  Concurra a todas las visitas de seguimiento para usted y el beb, como se lo haya indicado el mdico. Esta informacin no tiene como fin reemplazar el consejo del mdico. Asegrese de hacerle al mdico cualquier pregunta que tenga. Document Released: 04/17/2005 Document Revised: 11/28/2017 Document Reviewed: 11/28/2017 Elsevier Patient Education  2020 Elsevier Inc.   

## 2018-12-25 NOTE — Progress Notes (Signed)
I connected with Lisa Blake on 12/25/18 at  8:55 AM EDT by: Doximity video, when WebEx was not available and verified that I am speaking with the correct person using two identifiers.  Patient is located at home and provider is located at [facility].     The purpose of this virtual visit is to provide medical care while limiting exposure to the novel coronavirus. I discussed the limitations, risks, security and privacy concerns of performing an evaluation and management service by Doximity video and the availability of in person appointments. I also discussed with the patient that there may be a patient responsible charge related to this service. By engaging in this virtual visit, you consent to the provision of healthcare.  Additionally, you authorize for your insurance to be billed for the services provided during this visit.  The patient expressed understanding and agreed to proceed.  The following staff members participated in the virtual visit:  Carver Fila, [place] Partum Visit Note Subjective:    [name] is a 38 y.o. [phone] female who presents for a postpartum visit. She is 4 weeks postpartum following a low cervical transverse Cesarean section. I have fully reviewed the prenatal and intrapartum course. The delivery was at 35.1 gestational weeks. Outcome: repeat cesarean section, low transverse incision. Anesthesia: spinal. Postpartum course has been normal. Baby's course has been normal. Baby is feeding by both breast and bottle - Similac Neosure. Bleeding no bleeding. Bowel function is normal. Bladder function is normal. Patient is not sexually active. Contraception method is tubal ligation. Postpartum depression screening: negative.  Patient has continued using insulin as directed. She states that blood sugars are 70-85 fasting and 115-125 post prandial. She also continues to take her Vasotec.   The following portions of the patient's history were reviewed  and updated as appropriate: allergies, current medications, past family history, past medical history, past social history, past surgical history and problem list.  Review of Systems Pertinent items are noted in HPI.   Objective:  There were no vitals filed for this visit. Patient unable to locate her BP cuff. Agrees to come to [facility] tomorrow for a BP check.        Assessment:    Normal postpartum exam. Pap smear not done at today's visit. Last pap smear 07/2017 and results were normal. Next pap due 07/2020.   Plan:    1. Contraception: tubal ligation 2. Referral to [facility] for management of T2DM 3. Follow up in: 1 year or as needed.   Luvenia Redden, PA-C 12/25/2018 9:07 AM

## 2018-12-25 NOTE — Telephone Encounter (Signed)
Called pt to advise of Family Practice appt. With [facility] on 01/09/19 @ 10:30a, used [place] as Astronomer.Pt verbalized understanding.

## 2018-12-25 NOTE — Progress Notes (Signed)
Pt has an appointment at [facility] on 01/09/19 at 10:30a. To manage her Diabetes.

## 2018-12-25 NOTE — Progress Notes (Signed)
Patient records Glucose readings on paper logs

## 2018-12-26 ENCOUNTER — Ambulatory Visit: Payer: Medicaid Other

## 2019-01-09 ENCOUNTER — Ambulatory Visit (INDEPENDENT_AMBULATORY_CARE_PROVIDER_SITE_OTHER): Payer: Medicaid Other | Admitting: Primary Care

## 2019-01-21 ENCOUNTER — Ambulatory Visit (INDEPENDENT_AMBULATORY_CARE_PROVIDER_SITE_OTHER): Payer: Medicaid Other | Admitting: Primary Care

## 2019-01-21 ENCOUNTER — Encounter (INDEPENDENT_AMBULATORY_CARE_PROVIDER_SITE_OTHER): Payer: Self-pay | Admitting: Primary Care

## 2019-01-21 ENCOUNTER — Other Ambulatory Visit: Payer: Self-pay

## 2019-01-21 VITALS — BP 130/89 | HR 88 | Temp 97.8°F | Ht 66.0 in | Wt 157.8 lb

## 2019-01-21 DIAGNOSIS — R030 Elevated blood-pressure reading, without diagnosis of hypertension: Secondary | ICD-10-CM | POA: Diagnosis not present

## 2019-01-21 DIAGNOSIS — Z7689 Persons encountering health services in other specified circumstances: Secondary | ICD-10-CM

## 2019-01-21 DIAGNOSIS — E119 Type 2 diabetes mellitus without complications: Secondary | ICD-10-CM | POA: Diagnosis not present

## 2019-01-21 DIAGNOSIS — Z6825 Body mass index (BMI) 25.0-25.9, adult: Secondary | ICD-10-CM

## 2019-01-21 LAB — GLUCOSE, POCT (MANUAL RESULT ENTRY): POC Glucose: 168 mg/dl — AB (ref 70–99)

## 2019-01-21 LAB — POCT GLYCOSYLATED HEMOGLOBIN (HGB A1C): Hemoglobin A1C: 9.1 % — AB (ref 4.0–5.6)

## 2019-01-21 NOTE — Patient Instructions (Signed)
Diabetes mellitus tipo2, cuidados personales, en adultos Type 2 Diabetes Mellitus, Self Care, Adult Las personas que tienen diabetes tipo2 (diabetes mellitus tipo2) deben Theatre manager su nivel de azcar en la sangre (glucosa) dentro de un rango saludable. Es posible hacerlo por medio de lo siguiente:  Alimentacin.  Actividad fsica.  Cambios en el estilo de vida.  Medicamentos o insulina, si es necesario.  [employer] mdicos y de Producer, television/film/video. Cmo mantenerse informado sobre Retail buyer de azcar en la sangre   Controle su nivel de azcar en la sangre todos los [place], con la frecuencia que le hayan indicado.  Hgase controlar la A1c (hemoglobina A1c) dos o ms veces al ao. Hgase controlarla con ms frecuencia si el mdico se lo indica. El mdico fijar objetivos de tratamiento personales para usted. Generalmente, los resultados de los niveles de azcar en la sangre deben ser los siguientes:  Antes de las comidas (preprandial): de 80 a 179m/dl (de 4,4 a 7,231ml/l).  Despus de las comidas (posprandial): por debajo de 18066ml (32m49ml).  Nivel de A1c: menos del 7%. Cmo controlar los niveles altos y bajos de azcaLocation managerla sangre Signos de un nivel alto de azcar en la sangre Un nivel alto de azcar en la sangre se denomina hiperglucemia. Conozca los signos de un nivel alto de azcaDispensing opticians signos pueden incluir lo siguiente:  Sentir: ? Sed. ? Hambre. ? Mucho cansancio.  Necesidad de orinGarment/textile technologist mayor frecuencia que lo habitual.  Visin borrosa. Signos de un nivel bajo de azcar en la sangre Un nivel bajo de azcar en la sangre se denomina hipoglucemia. Este cuadro ocurre cuando el nivel de azcar en la sangre es igual o menor que 70mg65m(3,9mmol49m. Los signos pueden incluir lo siguiente:  Sentir: ? Hambre. ? Preocupacin o nervios (ansiedad). ? Sudoracin y piel hIntel Corporationnfusin. ? Mareos. ? Somnolencia. ? Ganas de vomitar (nuseas).  Tener:  ? Latidos cardacos acelerados. ? Dolor de cabezaNetherlandsmbios en la visin. ? Movimientos espasmdicos que no puede controlar (convulsiones). ? Hormigueo y falta de sensibilidad (entumecimiento) alrededor de la boca, labios o lengua.  Dificultades para hacer lo siguiente: ? Moverse (coordinacin). ? Dormir. ? Desmayos. ? Molestarse con facilidad (irritabilidad). Tratamiento del nivel bajo de azcar en la sangre Para tratar un nivel bajo de azcar en la sangre, ingiera un alimento o una bebida azucarada de inmediato. Si puede pensar con claridad y tragar de manera segura, siga la regla 15/15, que consiste en lo siguiente:  Consuma 15gramos de un hidrato de carbono de accin rpida (carbohidrato). Hable con su mdico acerca de cunto debera consumir.  Algunos hidratos de carbono de accin rpida son: ? Comprimidos de azcar (pastillas de glucosa). Tome 3 o 4 comprimidos. ? De 6 a 8unidades de caramelos duros. ? De 4 a 6onzas (de 120 a 150ml) 59mugo de frutas. ? De 4 a 6onzas (de 120 a 150ml) d36mfresco comn (no diettico). ? 1 cucharada (15ml) de61ml o azcar.  Contrlese el nivel de azcar en la sangre 15minutos49mpus de ingerir el hidrato de carbono.  Si el nivel de azcar en la sangre todava es igual o menor que 70mg/dl (332mol/l), i63mera nuevamente 15gramos de un hidrato de carbono.  Si el nivel de azcar en la sangre no supera los 70mg/dl (3,964m/l) desp63mde 3intentos, solicite ayuda de inmediato.  Ingiera una comida o una colacin en el transcurso de 1hora despus de que el nivel de azcar en la  sangre se haya normalizado. Tratamiento del nivel muy bajo de azcar en la sangre Si el nivel de azcar en la sangre es igual o menor que 12m/dl (354ml/l), significa que est muy bajo (hipoglucemia grave). Esto es unEngineer, maintenance (IT)No espere a ver si los sntomas desaparecen. Solicite atencin mdica de inmediato. Comunquese con el servicio de emergencias de su  localidad (911 en los [place]). Si su nivel de azcar en la sangre es muy bajo y no puede ingerir ningn alimento ni bebida, tal vez deba aplicarse una inyeccin de glucagn. Un familiar o un amigo deben aprender a controlarle el azcar en la sangre y a aplicarle una inyeccin de glucagn. Pregntele al mdico si debe tener un kit de inyecciones de glucagn en su casa. Siga estas indicaciones en su casa: Medicamentos  Aplquese la insulina y tome los medicamentos para la diabetes como se lo hayan indicado.  Si el mdico le indica que se aplique ms o menos insulina y que tome ms o menos medicamentos, haga exactamente lo que le diga.  No se quede sin insulina o sin medicamentos. La diabetes puede aumentar su riesgo de tener otras enfermedades a laBarrister's clerkEstas incluyen enfermedades cardacas y enfermedades renales. El mdico puede recetarle medicamentos para ayudar a que no tenga esMirantAlimentos   Opte por opciones de alimentos saludables. Estos incluyen los siguientes: ? Pollo, pescado, claras de huevos y frijoles. ? Avena, harina integral, trigo burgol, arroz integral, quinua y mijo. ? FrLambert Mody verduras frescas. ? Productos lcteos descremados. ? Frutos secos, aguacate, aceite de oliva y aceite de canola.  Consulte a un especialista en alimentacin (nutricionista). Este profesional puede ayudarle a elPaediatric nursen plan de alimentacin adecuado para usted.  Siga las indicaciones del mdico respecto de lo que no puede comer o beber.  Beba suficiente lquido para maContractoris (la orina) de color amarillo plido.  Lleve un registro de los hidratos de carbono que consume. Para hacerlo, lea las etiquetas de informacin nutricional y aprenda cules son los tamaos de las porciones de los alimentos.  Siga su plan para los das de enfermedad cuando no pueda comer ni beber normalmente. ElUnited Autolan con el mdico, de modo que est listo para usarlo. Actividad  Haga  ejercicio 3 o ms veces por semana.  No deje pasar ms de 2das sin hacer ejercicio.  Hable con el mdico antes de comenzar una rutina de ejercicio nueva. Es posible que el mdico le indique que haga cambios en: ? La cantidad de inDover Corporatione aplica o los medicamentos que toma. ? Cunto debe comer. Estilo de vida  No use productos que contengan tabaco. Estos incluyen cigarrillos, tabaco para maHigher education careers adviser ciPsychologist, sport and exerciseSi necesita ayuda para dejar de fumar, consulte al mdico.  Pregntele a su mdico qu cantidad de alcohol puede tomar.  Aprenda a sobrellevar el estrs. Si necesita ayuda para lograrlo, consulte al mdMeadWestvacoCuidado del cuerpo   Mantngase al da con las vacunas (inmunizaciones).  Hgase controlar los ojos y los pies por un mdico con la frecuencia que le hayan indicado.  Revise su piel y pies todos los daConcordiaExamine si tiene cortes, moretones, enrojecimiento, ampollas o llagas.  Cepllese los dientes y laToquervillePsese hilo dental una o ms veces por da.  Vaya al dentista una vez cada 28m65ms o con ms frecuencia.  Mantenga un peso saludable. Indicaciones generales  TomDelphi venta libre y los recetados solamente como se  lo haya indicado el mdico.  Comparta su plan de atencin de la diabetes con: ? Sus compaeros de trabajo o de la escuela. ? [employer] con las que [place].  Lleve consigo una tarjeta, o use un brazalete o una medalla que indique que tiene diabetes.  Concurra a todas las visitas de control como se lo haya indicado el mdico. Esto es importante. Preguntas para hacerle al mdico  Es necesario que me rena con Radio broadcast assistant en el cuidado de la diabetes?  Dnde puedo encontrar un grupo de apoyo para las personas con diabetes? Dnde buscar ms informacin Para obtener ms informacin sobre la diabetes, visite los siguientes sitios web:  American Diabetes Association (Asociacin Estadounidense de  la Diabetes): www.diabetes.org  American Association of Diabetes Educators (Asociacin Estadounidense de Instructores para el Cuidado de la Diabetes): www.diabeteseducator.org Resumen  Las personas que tienen diabetes tipo2 deben Theatre manager su nivel de azcar en la sangre (glucosa) dentro de un rango saludable.  Contrlese el nivel de azcar en la sangre todos los [place], con la frecuencia que le hayan indicado.  La diabetes puede aumentar su riesgo de tener otras enfermedades. El mdico puede recetarle medicamentos para ayudar a que no tenga [employer].  Concurra a todas las visitas de control como se lo haya indicado el mdico. Esto es importante. Esta informacin no tiene Marine scientist el consejo del mdico. Asegrese de hacerle al mdico cualquier pregunta que tenga. Document Released: 08/09/2015 Document Revised: 11/30/2017 Document Reviewed: 11/30/2017 Elsevier Patient Education  2020 [employer].

## 2019-01-21 NOTE — Progress Notes (Signed)
New Patient Office Visit  Subjective:  Patient ID: Lisa Blake, female    DOB: 1981/04/26  Age: 38 y.o. MRN: [id number]  CC:  Chief Complaint  Patient presents with  . New Patient (Initial Visit)    DM    HPI [name] presents for to establish care and management of diabetes.  Previously followed by OB/GYN Dr. Nicholaus Bloom for pregnancy.   Past Medical History:  Diagnosis Date  . Diabetes mellitus without complication (HCC)   . DM II (diabetes mellitus, type II), controlled (HCC)    Diagnosed after pregnancy, was 38 years old    Past Surgical History:  Procedure Laterality Date  . CESAREAN SECTION    . CESAREAN SECTION N/A 11/27/2018   Procedure: CESAREAN SECTION;  Surgeon: Levie Heritage, DO;  Location: [facility]S;  Service: Obstetrics;  Laterality: N/A;    Family History  Problem Relation Age of Onset  . Hypertension Mother   . Diabetes Father     Social History   Socioeconomic History  . Marital status: Single    Spouse name: Not on file  . Number of children: Not on file  . Years of education: Not on file  . Highest education level: Not on file  Occupational History  . Not on file  Social Needs  . Financial resource strain: Not on file  . Food insecurity    Worry: Often true    Inability: Sometimes true  . Transportation needs    Medical: No    Non-medical: No  Tobacco Use  . Smoking status: Never Smoker  . Smokeless tobacco: Never Used  Substance and Sexual Activity  . Alcohol use: Never    Frequency: Never  . Drug use: Never  . Sexual activity: Yes    Birth control/protection: None  Lifestyle  . Physical activity    Days per week: Not on file    Minutes per session: Not on file  . Stress: Not on file  Relationships  . Social Musician on phone: Not on file    Gets together: Not on file    Attends religious service: Not on file    Active member of club or organization: Not on file    Attends  meetings of clubs or organizations: Not on file    Relationship status: Not on file  . Intimate partner violence    Fear of current or ex partner: Not on file    Emotionally abused: Not on file    Physically abused: Not on file    Forced sexual activity: Not on file  Other Topics Concern  . Not on file  Social History Narrative  . Not on file    ROS Review of Systems  All other systems reviewed and are negative.   Objective:   Today's Vitals: BP 130/89 (BP Location: Right Arm, Patient Position: Sitting, Cuff Size: Normal)   Pulse 88   Temp 97.8 F (36.6 C) (Tympanic)   Ht 5\' 6"  (1.676 m)   Wt 157 lb 12.8 oz (71.6 kg)   SpO2 100%   Breastfeeding Yes   BMI 25.47 kg/m   Physical Exam Constitutional:      Appearance: Normal appearance.  HENT:     Head: Normocephalic.     Right Ear: Tympanic membrane normal.     Left Ear: Tympanic membrane normal.     Nose: Nose normal.  Eyes:     Extraocular Movements: Extraocular movements  intact.     Pupils: Pupils are equal, round, and reactive to light.  Neck:     Musculoskeletal: Normal range of motion.  Cardiovascular:     Rate and Rhythm: Normal rate and regular rhythm.  Pulmonary:     Effort: Pulmonary effort is normal.     Breath sounds: Normal breath sounds.  Abdominal:     General: Bowel sounds are normal.     Palpations: Abdomen is soft.  Musculoskeletal: Normal range of motion.  Skin:    General: Skin is warm and dry.  Neurological:     General: No focal deficit present.     Mental Status: She is alert and oriented to person, place, and time.  Psychiatric:        Mood and Affect: Mood normal.        Behavior: Behavior normal.     Assessment & Plan:  Cayleigh was seen today for new patient (initial visit).  Diagnoses and all orders for this visit:  Type 2 diabetes mellitus without complication, without long-term current use of insulin (HCC) ADA recommends the following therapeutic goals for glycemic control  related to A1c measurements: Goal of therapy: Less than 6.5 hemoglobin A1c.  Reference clinical practice recommendations. Foods that are high in carbohydrates are the following rice, potatoes, breads, sugars, and pastas.  Reduction in the intake (eating) will assist in lowering your blood sugars. -     HgB A1c -     Glucose (CBG) -     Ambulatory referral to Endocrinology  Encounter to establish care Gwinda Passe, NP-C will be your  (PCP) that will  provides both the first contact for a person with an undiagnosed health concern as well as continuing care of varied medical conditions, not limited by cause, organ system, or diagnosis.   Encounter for diabetic foot exam (HCC) Sensory exam of the foot is normal, tested with the monofilament. Good pulses, no lesions or ulcers, good peripheral pulses.  Elevated blood pressure reading in office without diagnosis of hypertension Blood pressure greater than 130/80 diagnosis of hypertension with 2 sequential  follow-ups.  Discussed lifestyle modification as a first-line anti-hypertensive treatment. Diet modification to include reductions of sodium and increase physical activity.  BMI 25.0-25.9,adult BMI is used to calculate to manner that amount of body fat based on height and weight.  BMI is used to determine the following categories underweight less than 18.5 , healthy weight  18.5- 24.5 overweight 25-29, obese above 30 and morbid obesity is greater than 40 . Obesity increases the risk of mortality, hypertension, high LDL, low HDL or triglycerides, type 2 diabetes, coronary heart disease, stroke, gallbladder disease, osteoarthritis (a breakdown of cartilage and bone within the joint) and mental illness such as depression and MVPSpecials.it   Outpatient Encounter Medications as of 01/21/2019  Medication Sig  . ACCU-CHEK FASTCLIX LANCETS MISC 1 Device by Percutaneous route 4 (four) times daily.  . AMBULATORY NON FORMULARY MEDICATION 1 Device by Other  route once a week. Blood pressure cuff medium size. Monitor regularly at home ICD 10 dx: O09.90  . Blood Pressure Monitoring MISC 1 Device by Does not apply route daily.  . enalapril (VASOTEC) 5 MG tablet Take 2 tablets (10 mg total) by mouth daily.  Marland Kitchen glucose blood (ACCU-CHEK GUIDE) test strip Use as instructed QID  . insulin glargine (LANTUS) 100 UNIT/ML injection Inject 0.09 mLs (9 Units total) into the skin daily. (Patient taking differently: Inject 20 Units into the skin at bedtime. )  .  insulin lispro (HUMALOG) 100 UNIT/ML injection Inject 0.04 mLs (4 Units total) into the skin 3 (three) times daily with meals. (Patient taking differently: Inject 6 Units into the skin 3 (three) times daily with meals. )  . Insulin Syringe-Needle U-100 (INSULIN SYRINGE .5CC/30GX5/16") 30G X 5/16" 0.5 ML MISC 1 Device by Does not apply route as directed.  . pantoprazole (PROTONIX) 20 MG tablet Take 1 tablet (20 mg total) by mouth daily.  . polyethylene glycol (MIRALAX / GLYCOLAX) 17 g packet Take 17 g by mouth daily.  . Prenatal Vit-Fe Fumarate-FA (PREPLUS) 27-1 MG TABS Take 1 tablet by mouth daily.  . simethicone (MYLICON) 80 MG chewable tablet Chew 1 tablet (80 mg total) by mouth as needed for flatulence.  . ferrous sulfate (FERROUSUL) 325 (65 FE) MG tablet Take 1 tablet (325 mg total) by mouth daily with breakfast.  . ibuprofen (ADVIL) 600 MG tablet Take 1 tablet (600 mg total) by mouth every 6 (six) hours as needed.  . [DISCONTINUED] oxyCODONE-acetaminophen (PERCOCET/ROXICET) 5-325 MG tablet Take 1-2 tablets by mouth every 4 (four) hours as needed for moderate pain. (Patient not taking: Reported on 12/05/2018)   No facility-administered encounter medications on file as of 01/21/2019.     Follow-up: Return if symptoms worsen or fail to improve, for Referring to endocrinologist .   Kerin Perna, NP

## 2019-01-21 NOTE — Progress Notes (Signed)
Pt has not had BP medication today

## 2019-10-31 ENCOUNTER — Ambulatory Visit: Payer: Medicaid Other | Admitting: Internal Medicine

## 2019-11-04 ENCOUNTER — Ambulatory Visit: Payer: Medicaid Other | Admitting: Internal Medicine

## 2019-11-05 IMAGING — US US ART/VEN ABD/PELV/SCROTUM DOPPLER LTD
1 series · 13 of 25 positions shown · non-contrast
Comparison: None.

CLINICAL DATA: 37 y/o F; positive urine pregnancy test. Pelvic
pain.



[Series 1: us art/ven abd/pelv/scrotum doppler ltd · 43 acquisitions, 13 frames shown]
[im 1/43]
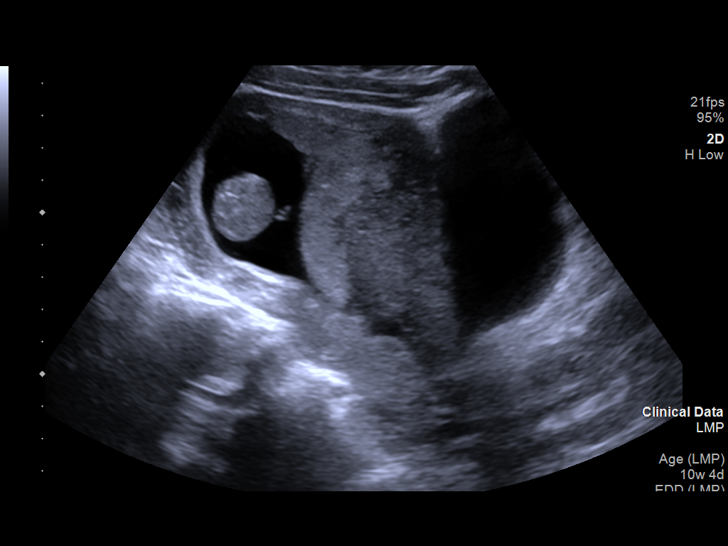
[im 4/43]
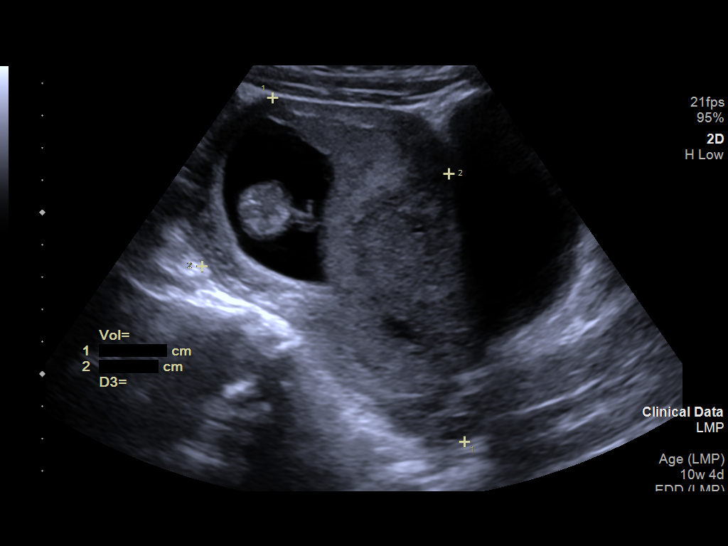
[im 8/43]
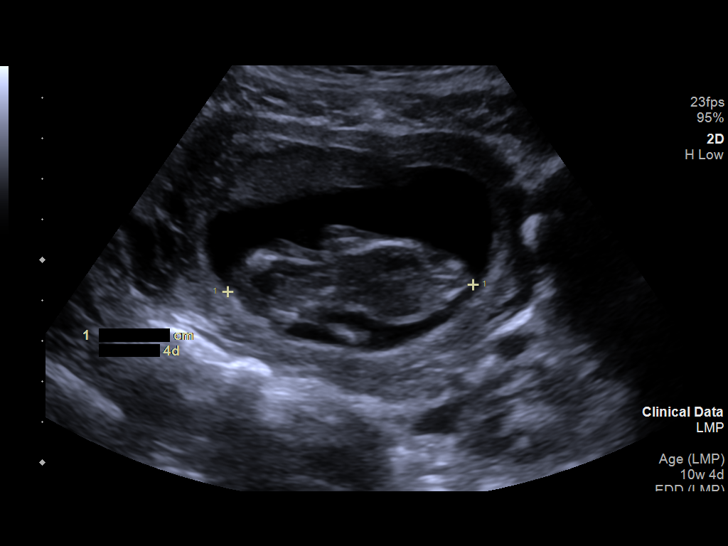
[im 11/43]
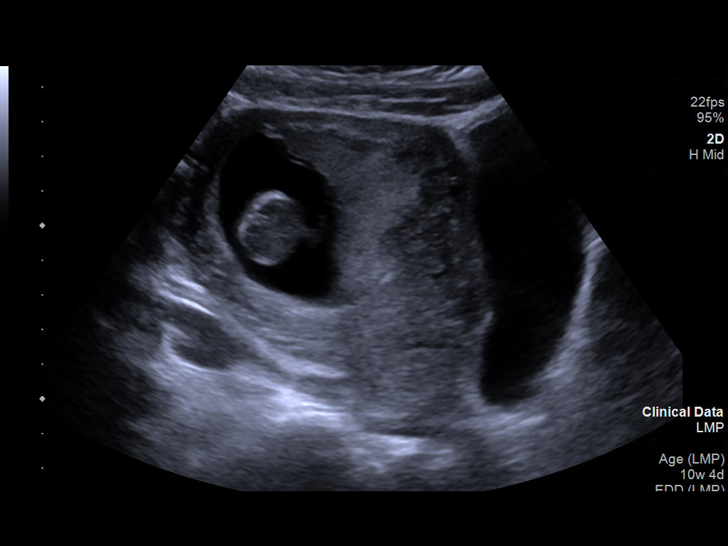
[im 15/43]
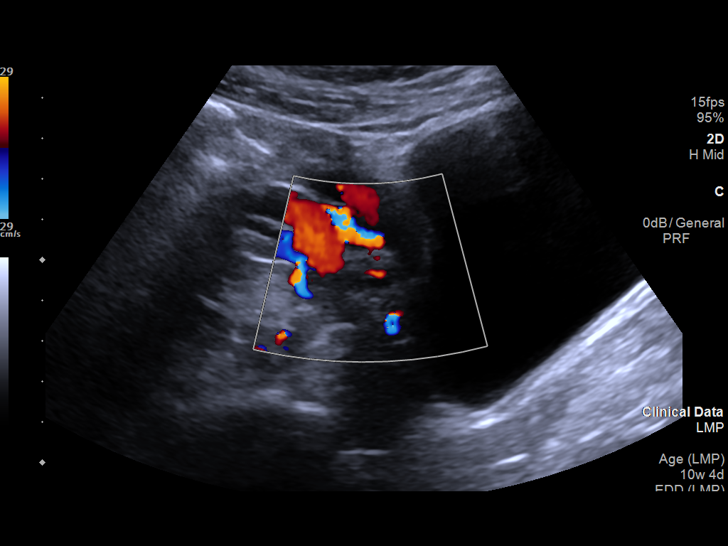
[im 18/43]
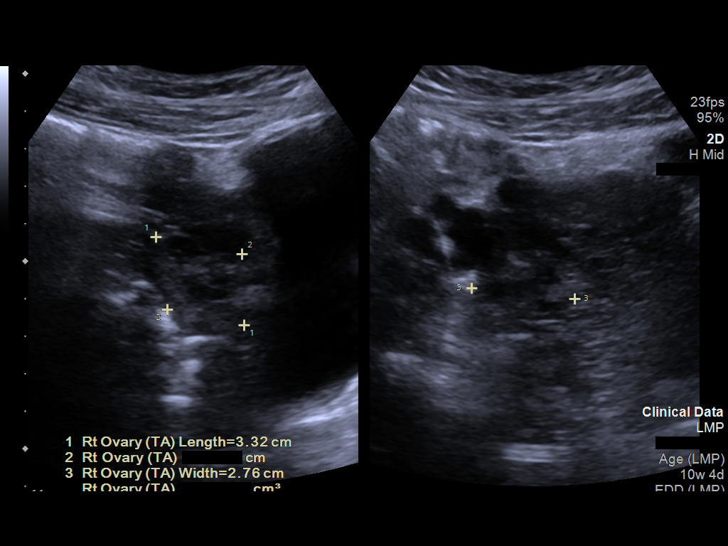
[im 22/43]
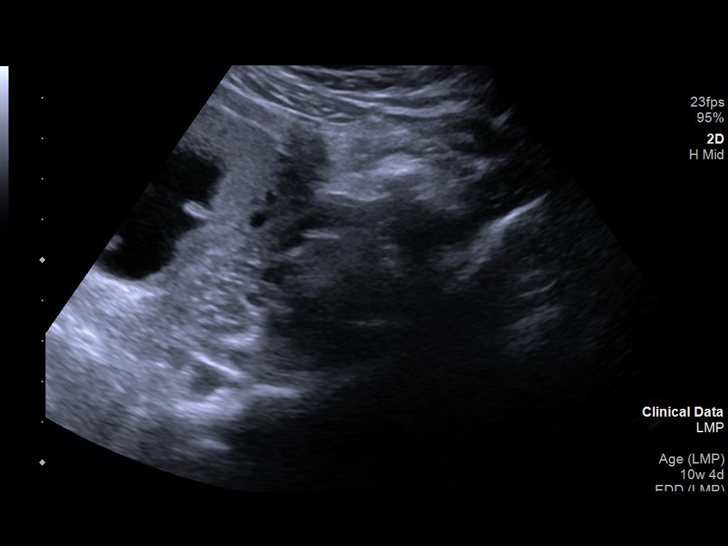
[im 25/43]
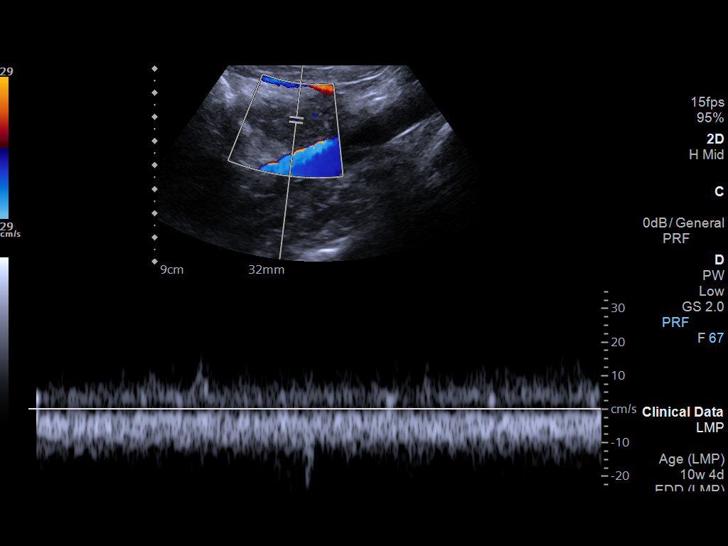
[im 29/43]
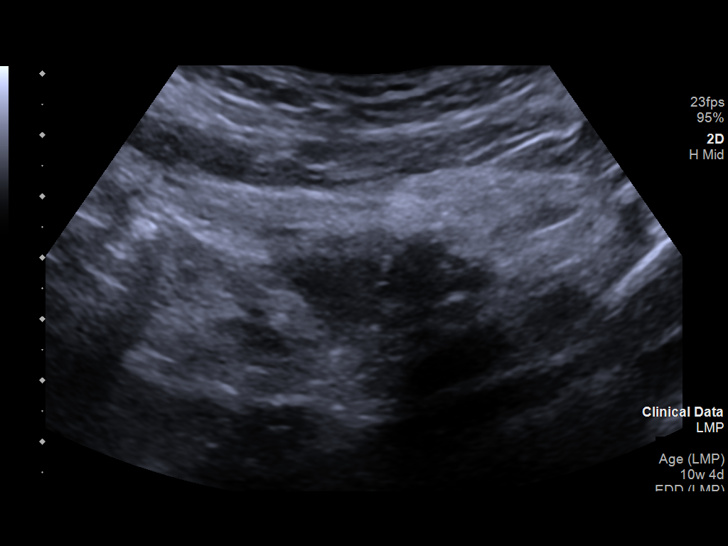
[im 32/43]
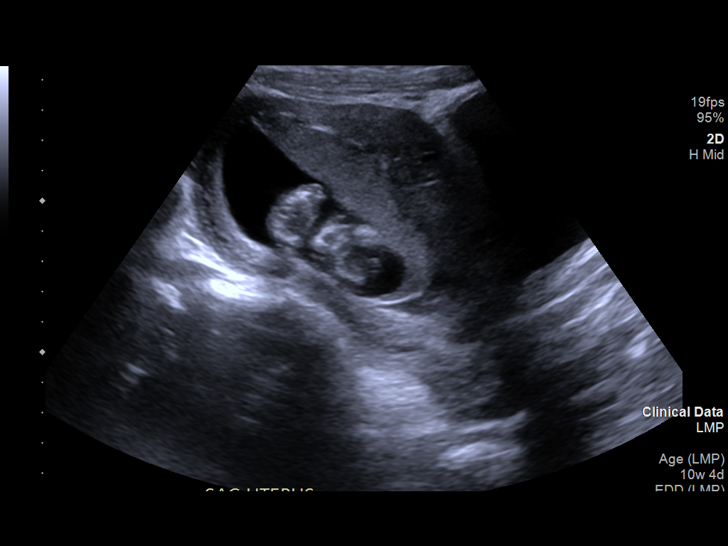
[im 36/43]
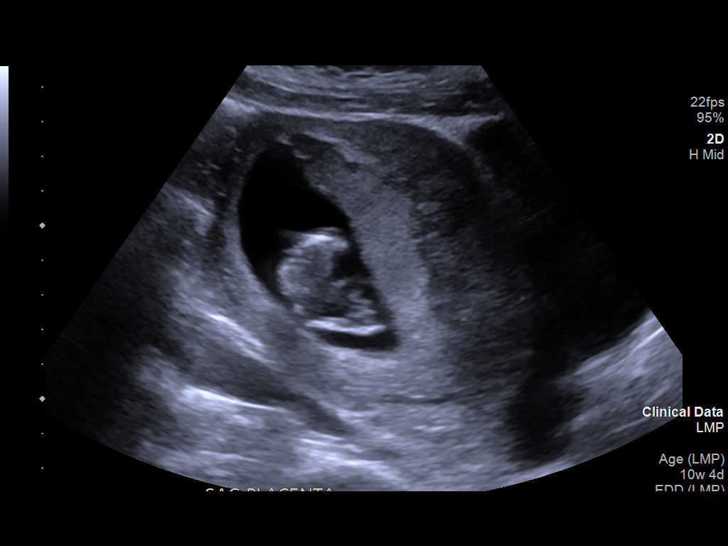
[im 39/43]
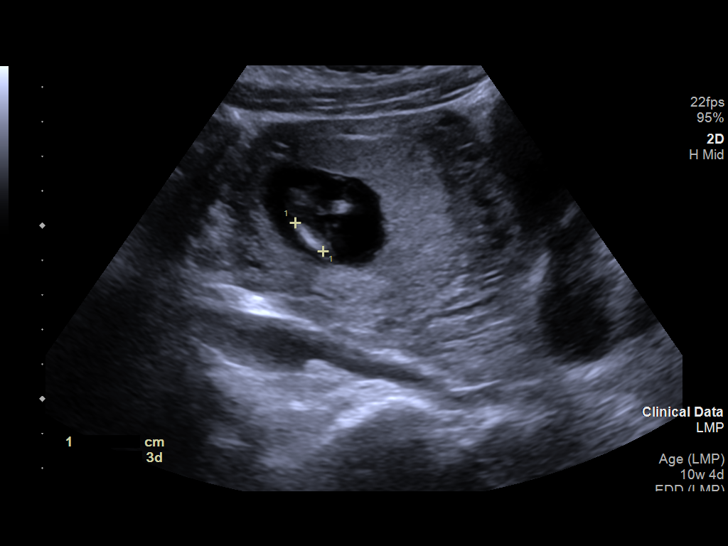
[im 43/43]
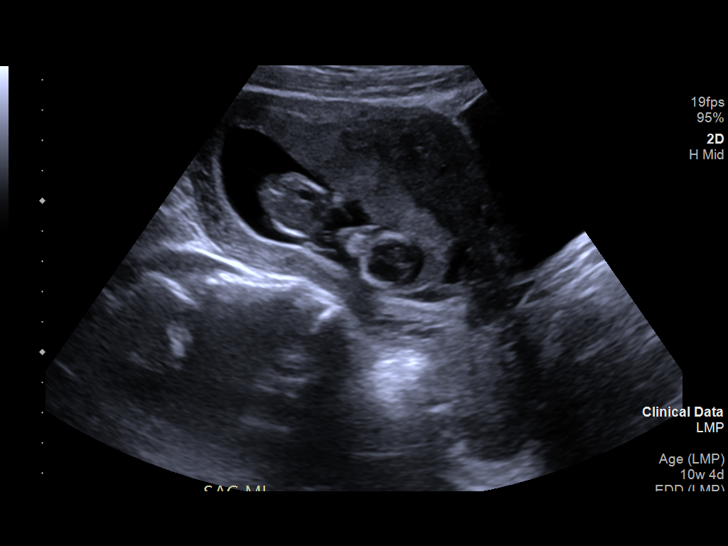

[13 of 25 positions shown; findings below may reference images not displayed]

FINDINGS: Intrauterine gestational sac: Single

Yolk sac:  Not Visualized.

Embryo:  Visualized.

Cardiac Activity: Visualized.

Heart Rate: 171 bpm

CRL: 60.6 mm   12 w   4 d                  US EDC: 12/31/2018

Subchorionic hemorrhage:  None visualized.

Maternal uterus/adnexae: Right ovary measures 3.3 x 2.5 x 2.8 cm,
11.9 cc. Left ovary measures 2.8 x 1.7 x 2.3 cm, 5.6 cc. Normal
appearance of the adnexa and uterus.

Pulsed Doppler evaluation of both ovaries demonstrates normal
appearing low-resistance arterial and venous waveforms.
IMPRESSION: Single live intrauterine pregnancy with estimated gestational age of
12 weeks and 4 days.

## 2020-01-06 ENCOUNTER — Other Ambulatory Visit (INDEPENDENT_AMBULATORY_CARE_PROVIDER_SITE_OTHER): Payer: Self-pay | Admitting: Primary Care

## 2020-01-06 ENCOUNTER — Ambulatory Visit (INDEPENDENT_AMBULATORY_CARE_PROVIDER_SITE_OTHER): Payer: Medicaid Other | Admitting: Primary Care

## 2020-01-06 ENCOUNTER — Encounter (INDEPENDENT_AMBULATORY_CARE_PROVIDER_SITE_OTHER): Payer: Self-pay | Admitting: Primary Care

## 2020-01-06 ENCOUNTER — Other Ambulatory Visit: Payer: Self-pay

## 2020-01-06 VITALS — BP 138/91 | HR 83 | Temp 97.8°F | Ht 66.0 in | Wt 159.8 lb

## 2020-01-06 DIAGNOSIS — Z23 Encounter for immunization: Secondary | ICD-10-CM | POA: Diagnosis not present

## 2020-01-06 DIAGNOSIS — I10 Essential (primary) hypertension: Secondary | ICD-10-CM

## 2020-01-06 DIAGNOSIS — Z111 Encounter for screening for respiratory tuberculosis: Secondary | ICD-10-CM

## 2020-01-06 DIAGNOSIS — E119 Type 2 diabetes mellitus without complications: Secondary | ICD-10-CM

## 2020-01-06 LAB — POCT GLYCOSYLATED HEMOGLOBIN (HGB A1C): Hemoglobin A1C: 9.6 % — AB (ref 4.0–5.6)

## 2020-01-06 MED ORDER — METFORMIN HCL 1000 MG PO TABS: 1000.0000 mg | ORAL_TABLET | Freq: Two times a day (BID) | ORAL | 3 refills | Status: DC

## 2020-01-06 MED ORDER — GLIPIZIDE 10 MG PO TABS: 10.0000 mg | ORAL_TABLET | Freq: Two times a day (BID) | ORAL | 3 refills | Status: DC

## 2020-01-06 MED ORDER — BLOOD GLUCOSE METER KIT: PACK | 0 refills | Status: AC

## 2020-01-06 MED ORDER — INSULIN ASPART 100 UNIT/ML ~~LOC~~ SOLN: 10.0000 [IU] | Freq: Three times a day (TID) | SUBCUTANEOUS | 99 refills | Status: DC

## 2020-01-06 MED ORDER — ENALAPRIL MALEATE 10 MG PO TABS: 10.0000 mg | ORAL_TABLET | Freq: Every day | ORAL | 0 refills | Status: DC

## 2020-01-06 NOTE — Progress Notes (Signed)
Needs rx for glucometer

## 2020-01-06 NOTE — Progress Notes (Signed)
Establish Patients  Subjective:  Patient ID: Lisa Blake, female    DOB: 1980-05-26  Age: 39 y.o. MRN: [id number]  CC:  Chief Complaint  Patient presents with  . Diabetes  . PPD Placement    HPI Ms. Yasha Tibbett Tamsen Roers is a 40 year old Hispanic female (interputor Quentin Angst [phone]) presents for establish care and management of Type 2 Diabetes.  Past Medical History:  Diagnosis Date  . Diabetes mellitus without complication ([place])   . DM II (diabetes mellitus, type II), controlled ([place])    Diagnosed after pregnancy, was 39 years old    Past Surgical History:  Procedure Laterality Date  . CESAREAN SECTION    . CESAREAN SECTION N/A 11/27/2018   Procedure: CESAREAN SECTION;  Surgeon: Truett Mainland, DO;  Location: [facility];  Service: Obstetrics;  Laterality: N/A;    Family History  Problem Relation Age of Onset  . Hypertension Mother   . Diabetes Father     Social History   Socioeconomic History  . Marital status: Single    Spouse name: Not on file  . Number of children: Not on file  . Years of education: Not on file  . Highest education level: Not on file  Occupational History  . Not on file  Tobacco Use  . Smoking status: Never Smoker  . Smokeless tobacco: Never Used  Vaping Use  . Vaping Use: Never used  Substance and Sexual Activity  . Alcohol use: Never  . Drug use: Never  . Sexual activity: Yes    Birth control/protection: None  Other Topics Concern  . Not on file  Social History Narrative  . Not on file   Social Determinants of Health   Financial Resource Strain:   . Difficulty of Paying Living Expenses: Not on file  Food Insecurity:   . Worried About Charity fundraiser in the Last Year: Not on file  . Ran Out of Food in the Last Year: Not on file  Transportation Needs:   . Lack of Transportation (Medical): Not on file  . Lack of Transportation (Non-Medical): Not on file  Physical Activity:   . Days of Exercise per Week:  Not on file  . Minutes of Exercise per Session: Not on file  Stress:   . Feeling of Stress : Not on file  Social Connections:   . Frequency of Communication with Friends and Family: Not on file  . Frequency of Social Gatherings with Friends and Family: Not on file  . Attends Religious Services: Not on file  . Active Member of Clubs or Organizations: Not on file  . Attends Archivist Meetings: Not on file  . Marital Status: Not on file  Intimate Partner Violence:   . Fear of Current or Ex-Partner: Not on file  . Emotionally Abused: Not on file  . Physically Abused: Not on file  . Sexually Abused: Not on file    ROS Review of Systems  Eyes: Positive for visual disturbance.  Endocrine: Positive for polydipsia, polyphagia and polyuria.  Psychiatric/Behavioral: The patient is nervous/anxious.   All other systems reviewed and are negative.   Objective:   Today's Vitals: BP (!) 138/91 (BP Location: Right Arm, Patient Position: Sitting, Cuff Size: Normal)   Pulse 83   Temp 97.8 F (36.6 C) (Oral)   Ht 5' 6"  (1.676 m)   Wt 159 lb 12.8 oz (72.5 kg)   LMP 12/13/2019 (Exact Date)   SpO2 100%  Breastfeeding No   BMI 25.79 kg/m   Physical Exam Vitals reviewed.  Constitutional:      Appearance: Normal appearance.  HENT:     Head: Normocephalic.     Right Ear: Tympanic membrane normal.     Left Ear: Tympanic membrane normal.     Nose: Nose normal.  Eyes:     Extraocular Movements: Extraocular movements intact.     Pupils: Pupils are equal, round, and reactive to light.  Cardiovascular:     Rate and Rhythm: Normal rate and regular rhythm.     Pulses: Normal pulses.     Heart sounds: Normal heart sounds.  Pulmonary:     Effort: Pulmonary effort is normal.     Breath sounds: Normal breath sounds.  Abdominal:     General: Bowel sounds are normal.  Musculoskeletal:        General: Normal range of motion.     Cervical back: Normal range of motion and neck supple.   Skin:    General: Skin is warm and dry.  Neurological:     Mental Status: She is alert and oriented to person, place, and time.  Psychiatric:        Mood and Affect: Mood normal.        Behavior: Behavior normal.        Thought Content: Thought content normal.        Judgment: Judgment normal.     Assessment & Plan:  Kaymarie was seen today for diabetes and ppd placement.  Diagnoses and all orders for this visit:  Type 2 diabetes mellitus without complication, without long-term current use of insulin (HCC) -     HgB A1c 9.6 -     blood glucose meter kit and supplies; Dispense based on patient and insurance preference. Use up to four times daily as directed. (FOR ICD-10 E10.9, E11.9). -     metFORMIN (GLUCOPHAGE) 1000 MG tablet; Take 1 tablet (1,000 mg total) by mouth 2 (two) times daily with a meal. -     glipiZIDE (GLUCOTROL) 10 MG tablet; Take 1 tablet (10 mg total) by mouth 2 (two) times daily before a meal. -     insulin aspart (NOVOLOG) 100 UNIT/ML injection; Inject 10 Units into the skin 3 (three) times daily before meals. For blood sugars 0-199  give 0 units of insulin, 200 -250 give  4 units of insulin, 251- 300 give 6 units, 301- 350 give 8units, 351-400  give 8 units, 401-450  give 10 units,> 450 give 12 units and call M.D. Discussed hypoglycemia protocol.  Essential hypertension Counseled on blood pressure goal of less than 130/80, near goal  low-sodium, DASH diet, medication compliance, 150 minutes of moderate intensity exercise per week. Discussed medication compliance, adverse effects. -     enalapril (VASOTEC) 10 MG tablet; Take 1 tablet (10 mg total) by mouth daily.  Encounter for diabetic foot exam ([place]) Completed   Need for immunization against influenza -     Flu Vaccine QUAD 36+ mos IM  PPD screening test -     PPD    Outpatient Encounter Medications as of 01/06/2020  Medication Sig  . ACCU-CHEK FASTCLIX LANCETS MISC 1 Device by Percutaneous route 4 (four)  times daily.  Marland Kitchen glucose blood (ACCU-CHEK GUIDE) test strip Use as instructed QID  . Insulin Syringe-Needle U-100 (INSULIN SYRINGE .5CC/30GX5/16") 30G X 5/16" 0.5 ML MISC 1 Device by Does not apply route as directed.  . [DISCONTINUED] insulin glargine (LANTUS) 100 UNIT/ML  injection Inject 0.09 mLs (9 Units total) into the skin daily. (Patient taking differently: Inject 20 Units into the skin at bedtime. )  . [DISCONTINUED] insulin lispro (HUMALOG) 100 UNIT/ML injection Inject 0.04 mLs (4 Units total) into the skin 3 (three) times daily with meals. (Patient taking differently: Inject 6 Units into the skin 3 (three) times daily with meals. )  . AMBULATORY NON FORMULARY MEDICATION 1 Device by Other route once a week. Blood pressure cuff medium size. Monitor regularly at home ICD 10 dx: O09.90  . blood glucose meter kit and supplies Dispense based on patient and insurance preference. Use up to four times daily as directed. (FOR ICD-10 E10.9, E11.9).  Marland Kitchen Blood Pressure Monitoring MISC 1 Device by Does not apply route daily.  . enalapril (VASOTEC) 10 MG tablet Take 1 tablet (10 mg total) by mouth daily.  . ferrous sulfate (FERROUSUL) 325 (65 FE) MG tablet Take 1 tablet (325 mg total) by mouth daily with breakfast.  . glipiZIDE (GLUCOTROL) 10 MG tablet Take 1 tablet (10 mg total) by mouth 2 (two) times daily before a meal.  . insulin aspart (NOVOLOG) 100 UNIT/ML injection Inject 10 Units into the skin 3 (three) times daily before meals. For blood sugars 0-199  give 0 units of insulin, 200 -250 give  4 units of insulin, 251- 300 give 6 units, 301- 350 give 8units, 351-400  give 8 units, 401-450  give 10 units,> 450 give 12 units and call M.D. Discussed hypoglycemia protocol.  . metFORMIN (GLUCOPHAGE) 1000 MG tablet Take 1 tablet (1,000 mg total) by mouth 2 (two) times daily with a meal.  . pantoprazole (PROTONIX) 20 MG tablet Take 1 tablet (20 mg total) by mouth daily.  . [DISCONTINUED] enalapril (VASOTEC) 5 MG  tablet Take 2 tablets (10 mg total) by mouth daily.  . [DISCONTINUED] ibuprofen (ADVIL) 600 MG tablet Take 1 tablet (600 mg total) by mouth every 6 (six) hours as needed.  . [DISCONTINUED] polyethylene glycol (MIRALAX / GLYCOLAX) 17 g packet Take 17 g by mouth daily.  . [DISCONTINUED] Prenatal Vit-Fe Fumarate-FA (PREPLUS) 27-1 MG TABS Take 1 tablet by mouth daily.  . [DISCONTINUED] simethicone (MYLICON) 80 MG chewable tablet Chew 1 tablet (80 mg total) by mouth as needed for flatulence.   No facility-administered encounter medications on file as of 01/06/2020.    Follow-up: Return in about 2 weeks (around 01/20/2020) for BP and fasting labs.   Kerin Perna, NP

## 2020-01-06 NOTE — Patient Instructions (Signed)
Gripe en los adultos Influenza, Adult A la gripe tambin se la conoce como "influenza". Es una infeccin en los pulmones, la nariz y la garganta (vas respiratorias). La causa un virus. La gripe provoca sntomas que son similares a los de un resfro. Tambin causa fiebre alta y dolores corporales. Se transmite fcilmente de persona a persona (es contagiosa). La mejor manera de prevenir la gripe es aplicndose la vacuna contra la gripe todos los aos. Cules son las causas? La causa de esta afeccin es el virus de la influenza. Puede contraer el virus de las siguientes maneras:  Respirar las gotitas que estn en el aire y que provienen de la tos o el estornudo de una persona que tiene el virus.  Tocar algo que tiene el virus (est contaminado) y luego tocarse la boca, la nariz o los ojos. Qu incrementa el riesgo? Hay ciertas cosas que lo pueden hacer ms propenso a tener gripe. Estas incluyen lo siguiente:  No lavarse las manos con frecuencia.  Tener contacto cercano con muchas personas durante la temporada de resfro y gripe.  Tocarse la boca, los ojos o la nariz sin antes lavarse las manos.  No recibir la vacuna antigripal todos los aos. Puede correr un mayor riesgo de tener gripe, junto con problemas graves como una infeccin pulmonar (neumona), si:  Es mayor de 65 aos de edad.  Est embarazada.  Tiene debilitado el sistema que combate las defensas (sistema inmunitario) debido a una enfermedad o porque toma determinados medicamentos.  Tiene una enfermedad prolongada (crnica), por ejemplo: ? Enfermedad cardaca, renal o pulmonar. ? Diabetes. ? Asma.  Tiene un trastorno heptico.  Tiene mucho sobrepeso (obesidad mrbida).  Tiene anemia. Esta es una afeccin que afecta a los glbulos rojos. Cules son los signos o los sntomas? Los sntomas normalmente comienzan de repente y duran entre 4 y 14 das. Pueden incluir los siguientes:  Fiebre y escalofros.  Dolores de  cabeza, dolores en el cuerpo o dolores musculares.  Dolor de garganta.  Tos.  Secrecin o congestin nasal.  Malestar en el pecho.  No desear comer en las cantidades normales (prdida del apetito).  Debilidad o cansancio (fatiga).  Mareos.  Malestar estomacal (nuseas) o ganas de devolver (vmitos). Cmo se trata? Si la gripe se encuentra de forma temprana, se la puede tratar con medicamentos que pueden ayudar a reducir la gravedad de la enfermedad y reducir su duracin (medicamentos antivirales). Estos pueden administrarse por boca (va oral) o por va (catter) intravenosa. Cuidarse en su hogar puede ayudar a que mejoren los sntomas. El mdico puede sugerirle lo siguiente:  Tomar medicamentos de venta libre.  Beber mucho lquido. La gripe suele desaparecer sola. Si tiene sntomas muy graves u otros problemas, puede recibir tratamiento en un hospital. Siga estas indicaciones en su casa:     Actividad  Descanse todo lo que sea necesario. Duerma lo suficiente.  Qudese en su casa y no concurra al trabajo o a la escuela, como se lo haya indicado el mdico. ? No salga de su casa hasta que no haya tenido fiebre por 24horas sin tomar medicamentos. ? Salga de su casa solo para ir al mdico. Comida y bebida  Tome una SRO (solucin de rehidratacin oral). Es una bebida que se vende en farmacias y tiendas.  Beba suficiente lquido para mantener el pis (la orina) de color amarillo plido.  En la medida en que pueda, beba lquidos claros en pequeas cantidades. Los lquidos transparentes son, por ejemplo: ? Agua. ? Trocitos   de hielo. ? Jugo de frutas con agua agregada (jugo de frutas diluido). ? Bebidas deportivas de bajas caloras.  En la medida en que pueda, consuma alimentos blandos y fciles de digerir en pequeas cantidades. Estos alimentos incluyen: ? Bananas. ? Pur de manzana. ? Arroz. ? Carnes magras. ? Tostadas. ? Galletas.  No coma ni beba lo  siguiente: ? Lquidos con alto contenido de azcar o cafena. ? Alcohol. ? Alimentos condimentados o con alto contenido de grasa. Indicaciones generales  Tome los medicamentos de venta libre y los recetados solamente como se lo haya indicado el mdico.  Use un humidificador de aire fro para que el aire de su casa est ms hmedo. Esto puede facilitar la respiracin.  Al toser o estornudar, cbrase la boca y la nariz.  Lvese las manos con agua y jabn frecuentemente, en especial despus de toser o estornudar. Use desinfectante para manos con alcohol si no dispone de agua y jabn.  Concurra a todas las visitas de control como se lo haya indicado el mdico. Esto es importante. Cmo se evita?   Colquese la vacuna antigripal todos los aos. Puede colocarse la vacuna contra la gripe a fines de verano, en otoo o en invierno. Pregntele al mdico cundo debe aplicarse la vacuna contra la gripe.  Evite el contacto con personas que estn enfermas durante el otoo y el invierno (la temporada de resfro y gripe). Comunquese con un mdico si:  Tiene sntomas nuevos.  Tiene los siguientes sntomas: ? Dolor en el pecho. ? Materia fecal lquida (diarrea). ? Fiebre.  La tos empeora.  Empieza a tener ms mucosidad.  Tiene malestar estomacal.  Vomita. Solicite ayuda inmediatamente si:  Le falta el aire.  Tiene dificultad para respirar.  La piel o las uas se ponen de un color azulado.  Presenta dolor muy intenso o rigidez en el cuello.  Tiene dolor de cabeza repentino.  Le duele la cara o el odo de forma repentina.  No puede comer ni beber sin vomitar. Resumen  La gripe es una infeccin en los pulmones, la nariz y la garganta. La causa un virus.  Tome los medicamentos de venta libre y los recetados solamente como se lo haya indicado el mdico.  Aplicarse la vacuna contra la gripe todos los aos es la mejor manera de evitar contagiarse la gripe. Esta informacin no tiene  como fin reemplazar el consejo del mdico. Asegrese de hacerle al mdico cualquier pregunta que tenga. Document Revised: 11/28/2017 Document Reviewed: 11/28/2017 Elsevier Patient Education  2020 Elsevier Inc.  

## 2020-01-09 ENCOUNTER — Ambulatory Visit (INDEPENDENT_AMBULATORY_CARE_PROVIDER_SITE_OTHER): Payer: Medicaid Other

## 2020-01-09 ENCOUNTER — Other Ambulatory Visit: Payer: Self-pay

## 2020-01-23 NOTE — Addendum Note (Signed)
Addended by: Grayce Sessions on: 01/23/2020 03:13 PM   Modules accepted: Level of Service

## 2020-01-24 IMAGING — US US MFM OB DETAIL +14 WK
1 series · 13 of 28 positions shown · non-contrast
Comparison: none

[Series 1: us mfm ob detail +14 wk · 13 of 80 slices shown]
[im 3/80]
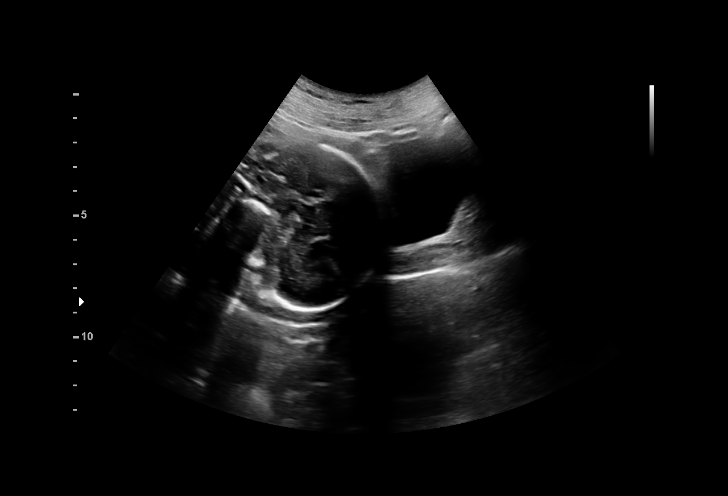
[im 9/80]
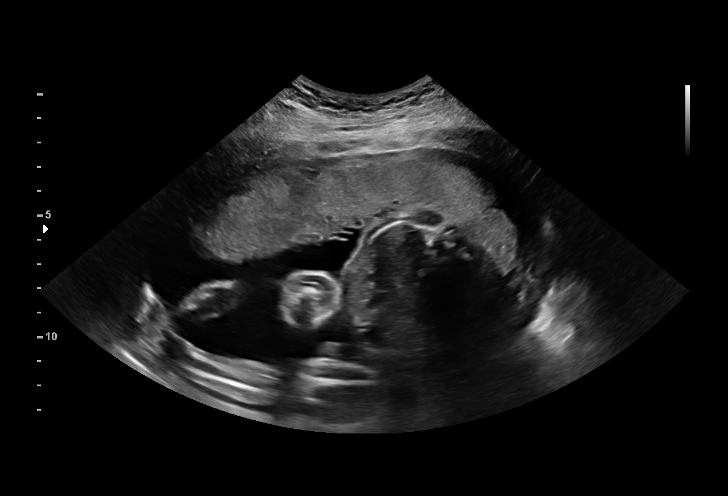
[im 15/80]
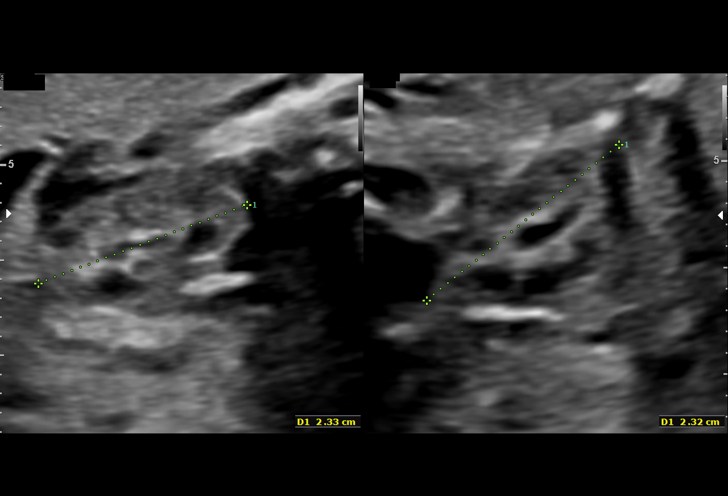
[im 21/80]
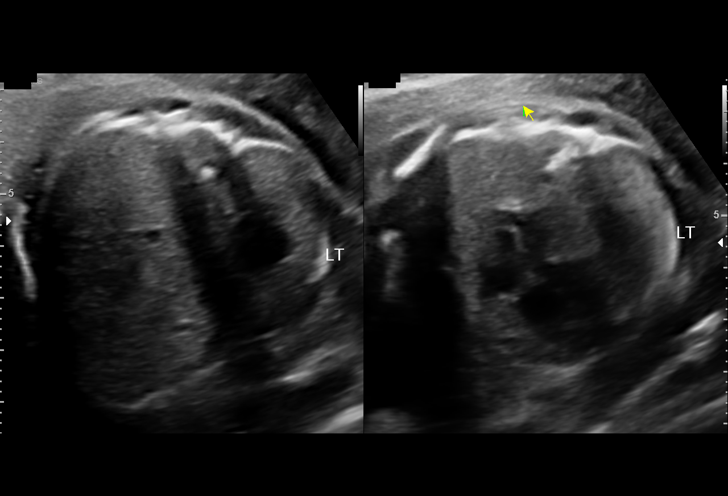
[im 27/80]
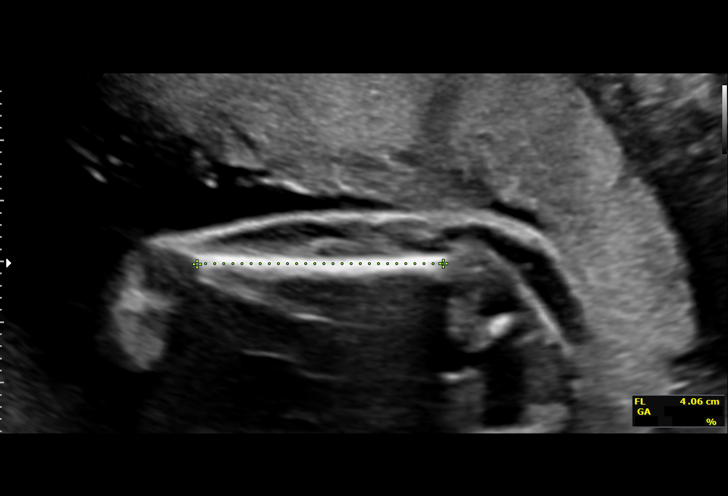
[im 33/80]
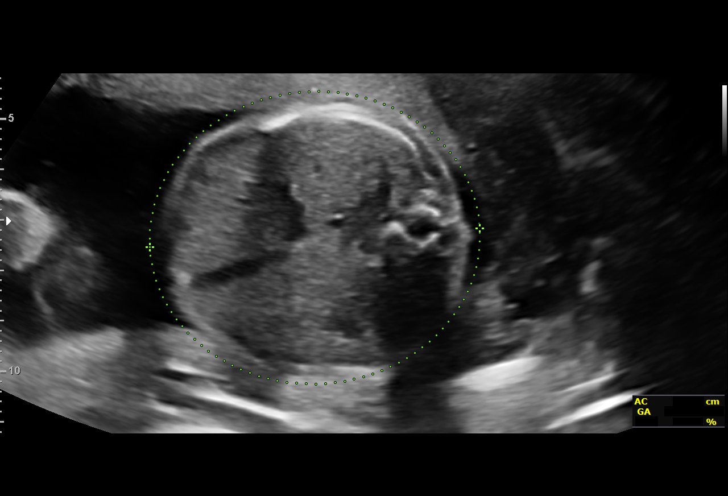
[im 41/80]
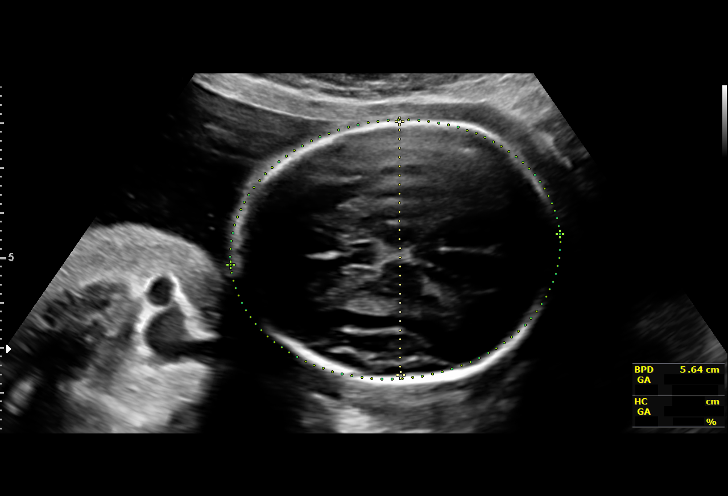
[im 47/80]
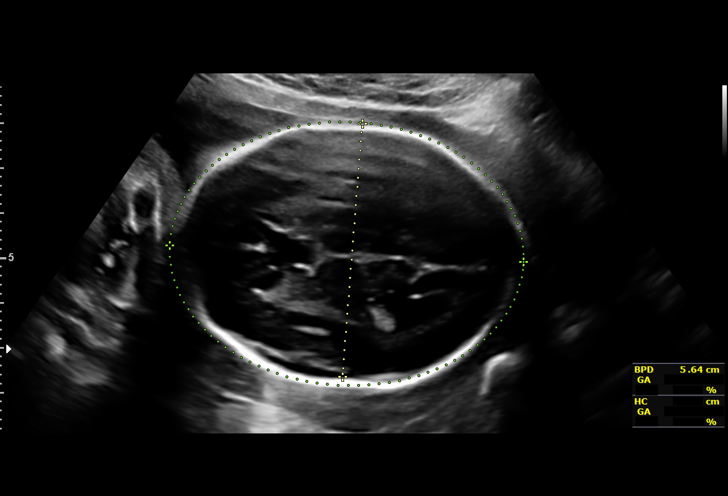
[im 53/80]
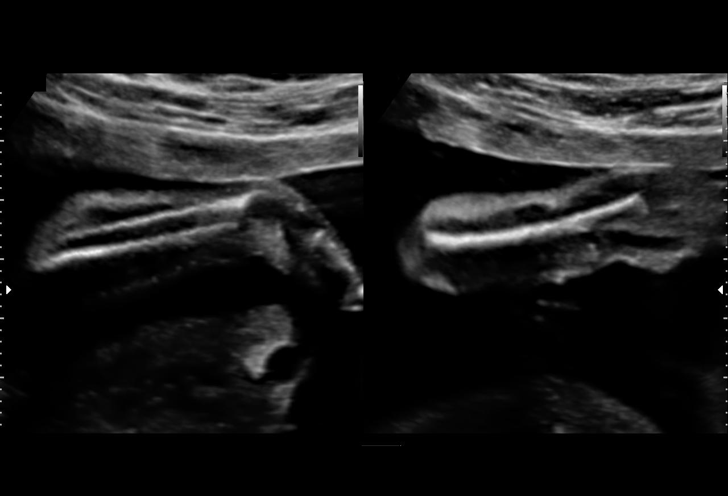
[im 59/80]
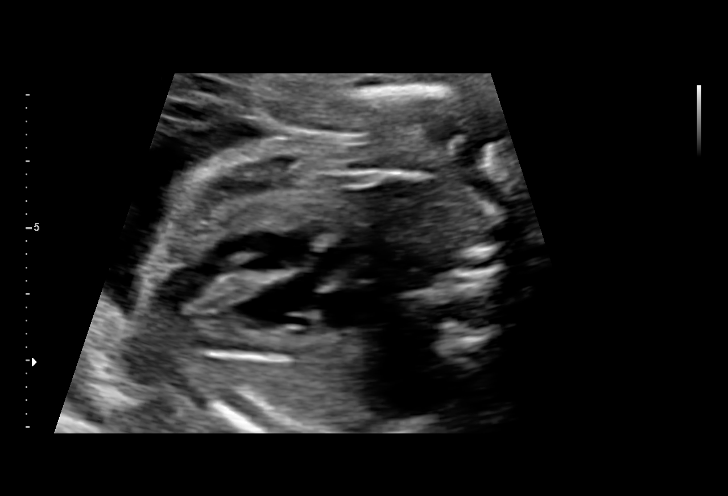
[im 65/80]
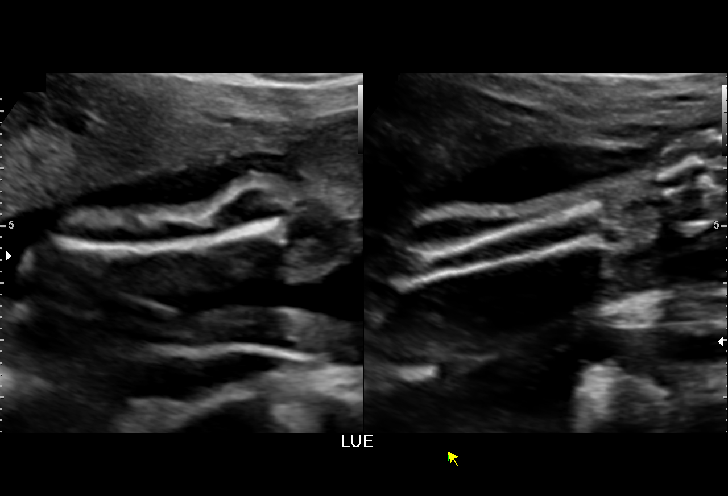
[im 71/80]
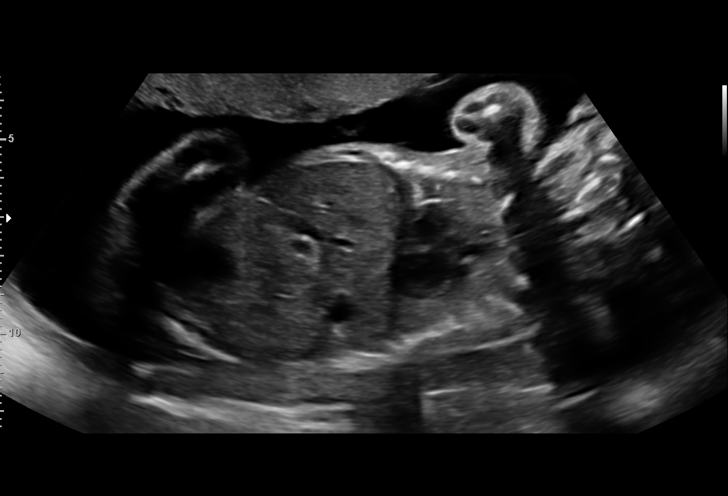
[im 77/80]
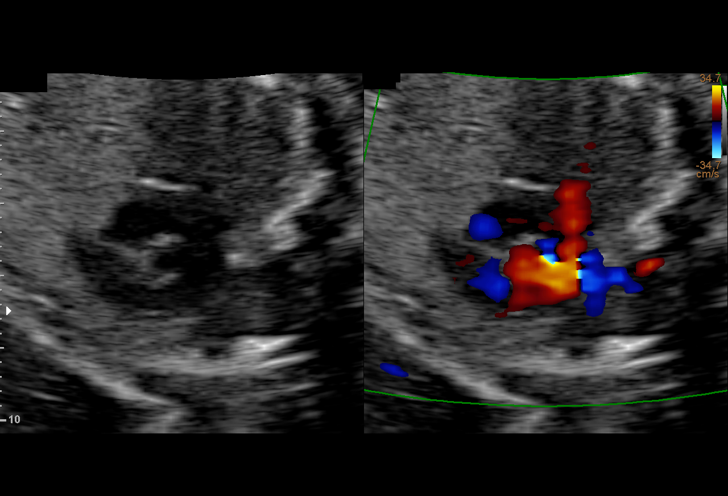

[13 of 28 positions shown; findings below may reference images not displayed]

[REDACTED]

 ----------------------------------------------------------------------

 ----------------------------------------------------------------------
Indications

  Advanced maternal age multigravida 35+,
  second trimester (37y)
  Encounter for antenatal screening for
  malformations (Low Risk NIPS)
  Pre-existing diabetes, type 2, in pregnancy,
  second trimester (Insulin)
  Anemia during pregnancy in second trimester
  Previous cesarean delivery, antepartum (2
  times)
  History of sickle cell trait
  22 weeks gestation of pregnancy
 ----------------------------------------------------------------------
Vital Signs

 BMI:
Fetal Evaluation

 Num Of Fetuses:         1
 Fetal Heart Rate(bpm):  144
 Cardiac Activity:       Observed
 Presentation:           Cephalic
 Placenta:               Anterior Fundal
 P. Cord Insertion:      Visualized

 Amniotic Fluid
 AFI FV:      Within normal limits

                             Largest Pocket(cm)

Biometry

 BPD:      56.4  mm     G. Age:  23w 2d         88  %    CI:         68.4   %    70 - 86
                                                         FL/HC:      19.2   %    18.4 -
 HC:       218   mm     G. Age:  23w 6d         95  %    HC/AC:      1.17        1.06 -
 AC:      185.8  mm     G. Age:  23w 3d         82  %    FL/BPD:     74.3   %    71 - 87
 FL:       41.9  mm     G. Age:  23w 5d         88  %    FL/AC:      22.6   %    20 - 24
 HUM:      37.8  mm     G. Age:  23w 2d         82  %
 CER:      27.3  mm     G. Age:  24w 5d       > 95  %
 CM:        6.2  mm

 Est. FW:     602  gm      1 lb 5 oz     70  %
OB History

 Gravidity:    3         Term:   2        Prem:   0        SAB:   0
 TOP:          0       Ectopic:  0        Living: 2
Gestational Age

 LMP:           22w 0d        Date:  04/09/18                 EDD:   01/14/19
 U/S Today:     23w 4d                                        EDD:   01/03/19
 Best:          22w 0d     Det. By:  LMP  (04/09/18)          EDD:   01/14/19
Anatomy

 Cranium:               Appears normal         Aortic Arch:            Appears normal
 Cavum:                 Appears normal         Ductal Arch:            Appears normal
 Ventricles:            Appears normal         Diaphragm:              Appears normal
 Choroid Plexus:        Appears normal         Stomach:                Appears normal, left
                                                                       sided
 Cerebellum:            Appears normal         Abdomen:                Appears normal
 Posterior Fossa:       Appears normal         Abdominal Wall:         Appears nml (cord
                                                                       insert, abd wall)
 Nuchal Fold:           Not applicable (>20    Cord Vessels:           Appears normal (3
                        wks GA)                                        vessel cord)
 Face:                  Appears normal         Kidneys:                Appear normal
                        (orbits and profile)
 Lips:                  Appears normal         Bladder:                Appears normal
 Thoracic:              Appears normal         Spine:                  Appears normal
 Heart:                 Not well visualized    Upper Extremities:      Appears normal
 RVOT:                  Not well visualized    Lower Extremities:      Appears normal
 LVOT:                  Not well visualized

 Other:  Heels and RT 5th digit visualized. Male gender. Nasal bone visualized.
Cervix Uterus Adnexa

 Cervix
 Normal appearance by transabdominal scan.

 Left Ovary
 Within normal limits.

 Right Ovary
 Within normal limits.
Impression

 We performed fetal anatomy scan. No makers of
 aneuploidies or fetal structural defects are seen. Fetal
 biometry is consistent with her previously-established dates.
 Amniotic fluid is normal and good fetal activity is seen.
 Placenta is anterior and there is no evidence of previa or
 accreta (history of cesarean deliveries).

 On cell-free fetal DNA screening, the risks of fetal
 aneuploidies are not increased.

 Patient has type 2 diabetes that is reportedly well-controlled
 with insulin.
Recommendations

 An appointment was made for her to return in 4 weeks for
 completion of fetal anatomy.
                 KEJUAN

## 2020-03-03 ENCOUNTER — Telehealth (INDEPENDENT_AMBULATORY_CARE_PROVIDER_SITE_OTHER): Payer: Self-pay

## 2020-03-03 ENCOUNTER — Other Ambulatory Visit: Payer: Medicaid Other

## 2020-03-03 DIAGNOSIS — Z20822 Contact with and (suspected) exposure to covid-19: Secondary | ICD-10-CM

## 2020-03-03 NOTE — Telephone Encounter (Signed)
Patient lost or misplaced pill bottle. She is aware that if refill is sent she may be required to pay out of pocket due to insurance not covering because it is not time for refill. Please send refill to [facility] on [address].

## 2020-03-03 NOTE — Telephone Encounter (Signed)
Patient called to make a medication refill for   enalapril (VASOTEC) 10 MG tablet  Blood Pressure Monitoring   Patient uses [facility] [address] [place], [place], [place] [address]  Please advice  [phone]

## 2020-03-04 LAB — NOVEL CORONAVIRUS, NAA: SARS-CoV-2, NAA: NOT DETECTED

## 2020-03-04 LAB — SARS-COV-2, NAA 2 DAY TAT

## 2020-03-08 ENCOUNTER — Ambulatory Visit (INDEPENDENT_AMBULATORY_CARE_PROVIDER_SITE_OTHER): Payer: Medicaid Other | Admitting: Primary Care

## 2020-03-16 ENCOUNTER — Other Ambulatory Visit: Payer: Self-pay

## 2020-03-16 ENCOUNTER — Ambulatory Visit (INDEPENDENT_AMBULATORY_CARE_PROVIDER_SITE_OTHER): Payer: Medicaid Other | Admitting: Primary Care

## 2020-03-16 ENCOUNTER — Encounter (INDEPENDENT_AMBULATORY_CARE_PROVIDER_SITE_OTHER): Payer: Self-pay | Admitting: Primary Care

## 2020-03-16 VITALS — BP 105/70 | HR 74 | Temp 97.5°F | Ht 66.0 in | Wt 147.4 lb

## 2020-03-16 DIAGNOSIS — I10 Essential (primary) hypertension: Secondary | ICD-10-CM

## 2020-03-16 DIAGNOSIS — E1165 Type 2 diabetes mellitus with hyperglycemia: Secondary | ICD-10-CM

## 2020-03-16 DIAGNOSIS — K219 Gastro-esophageal reflux disease without esophagitis: Secondary | ICD-10-CM

## 2020-03-16 MED ORDER — BLOOD PRESSURE MONITORING MISC: 1.0000 | Freq: Every day | 0 refills | Status: AC

## 2020-03-16 MED ORDER — PANTOPRAZOLE SODIUM 20 MG PO TBEC: 20.0000 mg | DELAYED_RELEASE_TABLET | Freq: Every day | ORAL | 1 refills | Status: DC

## 2020-03-16 NOTE — Patient Instructions (Signed)
Diabetes mellitus y actividad fsica Diabetes Mellitus and Exercise Hacer actividad fsica habitualmente es importante para el estado de salud general, en especial si tiene diabetes (diabetes mellitus). La actividad fsica no solo se reduce a bajar de peso. Aporta muchos beneficios para la salud, como aumento de la fuerza muscular y la densidad sea, y reduccin de las grasas corporales y el estrs. Esto mejora el estado fsico, la flexibilidad y la resistencia, y todo ello redunda en un mejor estado de salud general. La actividad fsica tiene beneficios adicionales para los diabticos, entre ellos:  Disminuye el apetito.  Ayuda a bajar y mantener la glucemia bajo control.  Baja la presin arterial.  Ayuda a controlar las cantidades de sustancias grasas (lpidos) en la sangre, como el colesterol y los triglicridos.  Mejora la respuesta del cuerpo a la insulina (optimizacin de la sensibilidad a la insulina).  Reduce la cantidad de insulina que el cuerpo necesita.  Reduce el riesgo de sufrir cardiopata coronaria de la siguiente forma: ? Baja los niveles de colesterol y triglicridos. ? Aumenta los niveles de colesterol bueno. ? Disminuye la glucemia. Cul es mi plan de actividad? El mdico o un educador para la diabetes certificado pueden ayudarlo a elaborar un plan respecto del tipo y de la frecuencia de actividad fsica (plan de actividades) adecuado para usted. Asegrese de lo siguiente:  Haga por lo menos 150minutos semanales de ejercicios de intensidad moderada o vigorosa. Estos podran ser caminatas dinmicas, ciclismo o gimnasia acutica. ? Haga ejercicios de elongacin y de fortalecimiento, como yoga o levantamiento de pesas, por lo menos 2veces por semana. ? Reparta la actividad en al menos 3das de la semana.  Haga algn tipo de actividad fsica todos los das. ? No deje pasar ms de 2das seguidos sin hacer algn tipo de actividad fsica. ? Evite permanecer inactivo  durante ms de 30minutos seguidos. Tmese descansos frecuentes para caminar o estirarse.  Elija un tipo de ejercicio o de actividad que disfrute y establezca objetivos realistas.  Comience lentamente y aumente de manera gradual la intensidad del ejercicio con el correr del tiempo. Qu debo saber acerca del control de la diabetes?   Contrlese la glucemia antes y despus de ejercitarse. ? Si la glucemia es de 240mg/dl (13,3mmol/l) o ms antes de comenzar a hacer actividad fsica, controle la orina para detectar la presencia de cetonas. Si tiene cetonas en la orina, no haga ejercicio hasta que la glucemia se normalice. ? Si la glucemia es de 100 mg/dl (5.6 mmol/l) o menos, tome una colacin que contenga entre 15 y 20 gramos de carbohidratos. Controle la glucemia 15 minutos despus de la colacin para asegurarse de que el nivel est por encima de 100 mg/dl (5.6 mmol/l) antes de comenzar a hacer actividad fsica.  Conozca los sntomas de la glucemia baja (hipoglucemia) y aprenda cmo tratarla. El riesgo de tener hipoglucemia aumenta durante y despus de hacer actividad fsica. Los sntomas frecuentes de hipoglucemia pueden incluir los siguientes: ? Hambre. ? Ansiedad. ? Sudoracin y piel hmeda. ? Confusin. ? Mareos o sensacin de desvanecimiento. ? Aumento de la frecuencia cardaca o palpitaciones. ? Visin borrosa. ? Hormigueo o adormecimiento alrededor de la boca, los labios o la lengua. ? Estremecimientos y temblores. ? Irritabilidad.  Tenga una colacin de carbohidratos de accin rpida disponible antes, durante y despus de ejercitarse, a fin de evitar o tratar la hipoglucemia.  Evite inyectarse insulina en las zonas del cuerpo que ejercitar. Por ejemplo, evite inyectarse insulina en: ? Los brazos,   si juega al tenis. ? Las piernas, si corre.  Lleve registros de sus hbitos de actividad fsica. Esto puede ayudarlos a usted y al mdico a adaptar el plan de control de la diabetes  segn sea necesario. Escriba los siguientes datos: ? Los alimentos que consume antes y despus de hacer actividad fsica. ? Los niveles de glucosa en la sangre antes y despus de hacer ejericios. ? El tipo y cantidad de actividad fsica que realiza. ? Cuando se prev que la insulina alcance su valor mximo, si usa insulina. No haga actividad fsica en los momentos en que insulina alcanza su valor mximo.  Cuando comience un ejercicio o una actividad nuevos, trabaje con el mdico para asegurarse de que la actividad sea segura para usted y para ajustar la insulina, los medicamentos o la ingesta de alimentos segn sea necesario.  Beba gran cantidad de agua mientras hace ejercicio para evitar la deshidratacin o los golpes de calor. Beba suficiente lquido como para mantener la orina clara o de color amarillo plido. Resumen  Hacer actividad fsica habitualmente es importante para el estado de salud general, en especial si tiene diabetes (diabetes mellitus).  La actividad fsica aporta muchos beneficios para la salud, como aumentar la fuerza muscular y la densidad sea, y reducir las grasas corporales y el estrs.  El mdico o un educador para la diabetes certificado pueden ayudarlo a elaborar un plan respecto del tipo y de la frecuencia de actividad fsica (plan de actividades) adecuado para usted.  Cuando comience un ejercicio o una actividad nuevos, trabaje con el mdico para asegurarse de que la actividad sea segura para usted y para ajustar la insulina, los medicamentos o la ingesta de alimentos segn sea necesario. Esta informacin no tiene como fin reemplazar el consejo del mdico. Asegrese de hacerle al mdico cualquier pregunta que tenga. Document Revised: 02/12/2017 Document Reviewed: 09/27/2015 Elsevier Patient Education  2020 [employer] Inc[employer]  

## 2020-03-16 NOTE — Progress Notes (Signed)
Established Patient Office Visit  Subjective:  Patient ID: Lisa Blake, female    DOB: 1980/09/13  Age: 39 y.o. MRN: [id number]  CC:  Chief Complaint  Patient presents with  . Hypertension    HPI Ms.Nancyann Cotterman Tamsen Roers is a 39 year old Spanish female presents for hypertension .  Past Medical History:  Diagnosis Date  . Diabetes mellitus without complication ([facility])   . DM II (diabetes mellitus, type II), controlled ([facility])    Diagnosed after pregnancy, was 39 years old    Past Surgical History:  Procedure Laterality Date  . CESAREAN SECTION    . CESAREAN SECTION N/A 11/27/2018   Procedure: CESAREAN SECTION;  Surgeon: Truett Mainland, DO;  Location: [facility];  Service: Obstetrics;  Laterality: N/A;    Family History  Problem Relation Age of Onset  . Hypertension Mother   . Diabetes Father     Social History   Socioeconomic History  . Marital status: Single    Spouse name: Not on file  . Number of children: Not on file  . Years of education: Not on file  . Highest education level: Not on file  Occupational History  . Not on file  Tobacco Use  . Smoking status: Never Smoker  . Smokeless tobacco: Never Used  Vaping Use  . Vaping Use: Never used  Substance and Sexual Activity  . Alcohol use: Never  . Drug use: Never  . Sexual activity: Yes    Birth control/protection: None  Other Topics Concern  . Not on file  Social History Narrative  . Not on file   Social Determinants of Health   Financial Resource Strain:   . Difficulty of Paying Living Expenses: Not on file  Food Insecurity:   . Worried About Charity fundraiser in the Last Year: Not on file  . Ran Out of Food in the Last Year: Not on file  Transportation Needs:   . Lack of Transportation (Medical): Not on file  . Lack of Transportation (Non-Medical): Not on file  Physical Activity:   . Days of Exercise per Week: Not on file  . Minutes of Exercise per Session: Not on file   Stress:   . Feeling of Stress : Not on file  Social Connections:   . Frequency of Communication with Friends and Family: Not on file  . Frequency of Social Gatherings with Friends and Family: Not on file  . Attends Religious Services: Not on file  . Active Member of Clubs or Organizations: Not on file  . Attends Archivist Meetings: Not on file  . Marital Status: Not on file  Intimate Partner Violence:   . Fear of Current or Ex-Partner: Not on file  . Emotionally Abused: Not on file  . Physically Abused: Not on file  . Sexually Abused: Not on file    Outpatient Medications Prior to Visit  Medication Sig Dispense Refill  . ACCU-CHEK FASTCLIX LANCETS MISC 1 Device by Percutaneous route 4 (four) times daily. 100 each 12  . AMBULATORY NON FORMULARY MEDICATION 1 Device by Other route once a week. Blood pressure cuff medium size. Monitor regularly at home ICD 10 dx: O09.90 1 Device 0  . blood glucose meter kit and supplies Dispense based on patient and insurance preference. Use up to four times daily as directed. (FOR ICD-10 E10.9, E11.9). 1 each 0  . enalapril (VASOTEC) 10 MG tablet Take 1 tablet (10 mg total) by  mouth daily. 90 tablet 0  . glipiZIDE (GLUCOTROL) 10 MG tablet Take 1 tablet (10 mg total) by mouth 2 (two) times daily before a meal. 60 tablet 3  . glucose blood (ACCU-CHEK GUIDE) test strip Use as instructed QID 100 each 12  . insulin aspart (NOVOLOG) 100 UNIT/ML injection Inject 10 Units into the skin 3 (three) times daily before meals. For blood sugars 0-199  give 0 units of insulin, 200 -250 give  4 units of insulin, 251- 300 give 6 units, 301- 350 give 8units, 351-400  give 8 units, 401-450  give 10 units,> 450 give 12 units and call M.D. Discussed hypoglycemia protocol. 10 mL PRN  . Insulin Syringe-Needle U-100 (INSULIN SYRINGE .5CC/30GX5/16") 30G X 5/16" 0.5 ML MISC 1 Device by Does not apply route as directed. 100 each 5  . metFORMIN (GLUCOPHAGE) 1000 MG tablet  Take 1 tablet (1,000 mg total) by mouth 2 (two) times daily with a meal. 180 tablet 3  . Blood Pressure Monitoring MISC 1 Device by Does not apply route daily. 1 Device 0  . ferrous sulfate (FERROUSUL) 325 (65 FE) MG tablet Take 1 tablet (325 mg total) by mouth daily with breakfast. 30 tablet 0  . pantoprazole (PROTONIX) 20 MG tablet Take 1 tablet (20 mg total) by mouth daily. 30 tablet 1   No facility-administered medications prior to visit.    Allergies  Allergen Reactions  . Penicillins     Did it involve swelling of the face/tongue/throat, SOB, or low BP? Unknown Did it involve sudden or severe rash/hives, skin peeling, or any reaction on the inside of your mouth or nose? Unknown Did you need to seek medical attention at a hospital or doctor's office? Unknown When did it last happen?childhood If all above answers are "NO", may proceed with cephalosporin use.     ROS Review of Systems  All other systems reviewed and are negative.     Objective:    Physical Exam Vitals reviewed.  Constitutional:      Appearance: Normal appearance.  HENT:     Head: Normocephalic.     Right Ear: Tympanic membrane normal.     Left Ear: Tympanic membrane normal.     Nose: Nose normal.  Cardiovascular:     Rate and Rhythm: Normal rate and regular rhythm.  Pulmonary:     Effort: Pulmonary effort is normal.     Breath sounds: Normal breath sounds.  Abdominal:     General: Bowel sounds are normal.  Musculoskeletal:     Cervical back: Normal range of motion.  Skin:    General: Skin is warm and dry.  Neurological:     Mental Status: She is alert and oriented to person, place, and time.  Psychiatric:        Mood and Affect: Mood normal.        Behavior: Behavior normal.        Thought Content: Thought content normal.        Judgment: Judgment normal.     BP 105/70 (BP Location: Right Arm, Patient Position: Sitting, Cuff Size: Normal)   Pulse 74   Temp (!) 97.5 F (36.4 C)  (Temporal)   Ht _0  (1.676 m)   Wt 147 lb 6.4 oz (66.9 kg)   LMP 03/08/2020 (Exact Date)   SpO2 98%   Breastfeeding Yes   BMI 23.79 kg/m  Wt Readings from Last 3 Encounters:  03/16/20 147 lb 6.4 oz (66.9 kg)  01/06/20 159 lb 12.8  oz (72.5 kg)  01/21/19 157 lb 12.8 oz (71.6 kg)     Health Maintenance Due  Topic Date Due  . PNEUMOCOCCAL POLYSACCHARIDE VACCINE AGE 58-64 HIGH RISK  Never done  . OPHTHALMOLOGY EXAM  Never done  . COVID-19 Vaccine (1) Never done  . PAP SMEAR-Modifier  Never done    There are no preventive care reminders to display for this patient.  Lab Results  Component Value Date   TSH 0.946 06/28/2018   Lab Results  Component Value Date   WBC 17.8 (H) 11/27/2018   HGB 10.5 (L) 11/27/2018   HCT 34.6 (L) 11/27/2018   MCV 70.6 (L) 11/27/2018   PLT 181 11/27/2018     Assessment & Plan:  Toniann was seen today for hypertension.  Diagnoses and all orders for this visit:  Gastroesophageal reflux disease without esophagitis Discussed eating small frequent meal, reduction in acidic foods, fried foods ,spicy foods, alcohol caffeine and tobacco and certain medications. Avoid laying down after eating 35mns-1hour, elevated head of the bed  Uncontrolled type 2 diabetes mellitus with hyperglycemia [place]     ADA recommends the following therapeutic goals for glycemic control related to A1c measurements: Goal of therapy: Less than 6.5 hemoglobin A1c.  Reference clinical practice recommendations. Foods that are high in carbohydrates are the following rice, potatoes, breads, sugars, and pastas.  Reduction in the intake (eating) will assist in lowering your blood sugars.  Essential hypertension Bp low on enalapril 175mneeds to take 1/2 daily   Other orders -     Blood Pressure Monitoring MISC; 1 Device by Does not apply route daily.    Meds ordered this encounter  Medications  . DISCONTD: pantoprazole (PROTONIX) 20 MG tablet    Sig: Take 1 tablet (20 mg  total) by mouth daily.    Dispense:  90 tablet    Refill:  1  . Blood Pressure Monitoring MISC    Sig: 1 Device by Does not apply route daily.    Dispense:  1 Device    Refill:  0    DX code [id number] to the medicaid DME with diagnosis code included BIN [id number] NCDME if rejected due to model or other reasons call [phone] NPI number and RX number  . pantoprazole (PROTONIX) 20 MG tablet    Sig: Take 1 tablet (20 mg total) by mouth daily.    Dispense:  90 tablet    Refill:  1    Follow-up: Return in about 5 weeks (around 04/20/2020) for DM/pap and fasting labs.    MiKerin PernaNP

## 2020-03-30 ENCOUNTER — Other Ambulatory Visit (HOSPITAL_COMMUNITY)
Admission: RE | Admit: 2020-03-30 | Discharge: 2020-03-30 | Disposition: A | Discharge status: Home / Self Care | Payer: Medicaid Other | Source: Ambulatory Visit | Attending: Primary Care | Admitting: Primary Care

## 2020-03-30 ENCOUNTER — Encounter (INDEPENDENT_AMBULATORY_CARE_PROVIDER_SITE_OTHER): Payer: Self-pay | Admitting: Primary Care

## 2020-03-30 ENCOUNTER — Ambulatory Visit (INDEPENDENT_AMBULATORY_CARE_PROVIDER_SITE_OTHER): Payer: Medicaid Other | Admitting: Primary Care

## 2020-03-30 ENCOUNTER — Other Ambulatory Visit: Payer: Self-pay

## 2020-03-30 VITALS — BP 132/84 | HR 96 | Temp 97.5°F | Ht 66.0 in | Wt 141.8 lb

## 2020-03-30 DIAGNOSIS — H539 Unspecified visual disturbance: Secondary | ICD-10-CM | POA: Diagnosis not present

## 2020-03-30 DIAGNOSIS — Z113 Encounter for screening for infections with a predominantly sexual mode of transmission: Secondary | ICD-10-CM | POA: Diagnosis present

## 2020-03-30 DIAGNOSIS — Z794 Long term (current) use of insulin: Secondary | ICD-10-CM

## 2020-03-30 DIAGNOSIS — E782 Mixed hyperlipidemia: Secondary | ICD-10-CM

## 2020-03-30 DIAGNOSIS — I10 Essential (primary) hypertension: Secondary | ICD-10-CM

## 2020-03-30 DIAGNOSIS — Z124 Encounter for screening for malignant neoplasm of cervix: Secondary | ICD-10-CM | POA: Insufficient documentation

## 2020-03-30 DIAGNOSIS — E119 Type 2 diabetes mellitus without complications: Secondary | ICD-10-CM

## 2020-03-30 MED ORDER — LANTUS SOLOSTAR 100 UNIT/ML ~~LOC~~ SOPN: 12.0000 [IU] | PEN_INJECTOR | Freq: Every day | SUBCUTANEOUS | 99 refills | Status: DC

## 2020-03-30 MED ORDER — LOSARTAN POTASSIUM 25 MG PO TABS: 25.0000 mg | ORAL_TABLET | Freq: Every day | ORAL | 1 refills | Status: DC

## 2020-03-30 NOTE — Progress Notes (Signed)
Enalapril causes dry cough and pain in throat

## 2020-03-31 LAB — CMP14+EGFR
ALT: 14 IU/L (ref 0–32)
AST: 11 IU/L (ref 0–40)
Albumin/Globulin Ratio: 1.4 (ref 1.2–2.2)
Albumin: 4.2 g/dL (ref 3.8–4.8)
Alkaline Phosphatase: 62 IU/L (ref 44–121)
BUN/Creatinine Ratio: 15 (ref 9–23)
BUN: 11 mg/dL (ref 6–20)
Bilirubin Total: 0.8 mg/dL (ref 0.0–1.2)
CO2: 24 mmol/L (ref 20–29)
Calcium: 8.9 mg/dL (ref 8.7–10.2)
Chloride: 100 mmol/L (ref 96–106)
Creatinine, Ser: 0.71 mg/dL (ref 0.57–1.00)
GFR calc Af Amer: 124 mL/min/{1.73_m2} (ref >59)
GFR calc non Af Amer: 108 mL/min/{1.73_m2} (ref >59)
Globulin, Total: 3.1 g/dL (ref 1.5–4.5)
Glucose: 303 mg/dL — ABNORMAL HIGH (ref 65–99)
Potassium: 4 mmol/L (ref 3.5–5.2)
Sodium: 136 mmol/L (ref 134–144)
Total Protein: 7.3 g/dL (ref 6.0–8.5)

## 2020-03-31 LAB — CBC WITH DIFFERENTIAL/PLATELET
Basophils Absolute: 0.1 10*3/uL (ref 0.0–0.2)
Basos: 1 %
EOS (ABSOLUTE): 0.1 10*3/uL (ref 0.0–0.4)
Eos: 1 %
Hematocrit: 36.3 % (ref 34.0–46.6)
Hemoglobin: 10.4 g/dL — ABNORMAL LOW (ref 11.1–15.9)
Immature Grans (Abs): 0 10*3/uL (ref 0.0–0.1)
Immature Granulocytes: 0 %
Lymphocytes Absolute: 2.1 10*3/uL (ref 0.7–3.1)
Lymphs: 34 %
MCH: 20.4 pg — ABNORMAL LOW (ref 26.6–33.0)
MCHC: 28.7 g/dL — ABNORMAL LOW (ref 31.5–35.7)
MCV: 71 fL — ABNORMAL LOW (ref 79–97)
Monocytes Absolute: 0.4 10*3/uL (ref 0.1–0.9)
Monocytes: 7 %
Neutrophils Absolute: 3.6 10*3/uL (ref 1.4–7.0)
Neutrophils: 57 %
Platelets: 176 10*3/uL (ref 150–450)
RBC: 5.11 x10E6/uL (ref 3.77–5.28)
RDW: 18.7 % — ABNORMAL HIGH (ref 11.7–15.4)
WBC: 6.3 10*3/uL (ref 3.4–10.8)

## 2020-03-31 LAB — CERVICOVAGINAL ANCILLARY ONLY
Bacterial Vaginitis (gardnerella): NEGATIVE
Candida Glabrata: POSITIVE — AB
Candida Vaginitis: NEGATIVE
Chlamydia: NEGATIVE
Comment: NEGATIVE
Comment: NEGATIVE
Comment: NEGATIVE
Comment: NEGATIVE
Comment: NEGATIVE
Comment: NORMAL
Neisseria Gonorrhea: NEGATIVE
Trichomonas: NEGATIVE

## 2020-03-31 LAB — LIPID PANEL
Chol/HDL Ratio: 2.9 ratio (ref 0.0–4.4)
Cholesterol, Total: 183 mg/dL (ref 100–199)
HDL: 64 mg/dL (ref >39)
LDL Chol Calc (NIH): 105 mg/dL — ABNORMAL HIGH (ref 0–99)
Triglycerides: 78 mg/dL (ref 0–149)
VLDL Cholesterol Cal: 14 mg/dL (ref 5–40)

## 2020-03-31 LAB — MICROALBUMIN, URINE: Microalbumin, Urine: 11.8 ug/mL

## 2020-04-01 LAB — CYTOLOGY - PAP
Comment: NEGATIVE
Diagnosis: NEGATIVE
High risk HPV: NEGATIVE

## 2020-04-04 NOTE — Progress Notes (Signed)
Established Patient Office Visit  Subjective:  Patient ID: Lisa Blake, female    DOB: 08-19-1980  Age: 39 y.o. MRN: [id number]  CC:  Chief Complaint  Patient presents with  . Gynecologic Exam    HPI Lisa Blake presents for gyn exam. She is also concern with a dry cough . She is currently on a ACE inhibitor will discontinue a common side affect.  Past Medical History:  Diagnosis Date  . Diabetes mellitus without complication ([place])   . DM II (diabetes mellitus, type II), controlled ([place])    Diagnosed after pregnancy, was 39 years old    Past Surgical History:  Procedure Laterality Date  . CESAREAN SECTION    . CESAREAN SECTION N/A 11/27/2018   Procedure: CESAREAN SECTION;  Surgeon: Truett Mainland, DO;  Location: [place] [facility];  Service: Obstetrics;  Laterality: N/A;    Family History  Problem Relation Age of Onset  . Hypertension Mother   . Diabetes Father     Social History   Socioeconomic History  . Marital status: Single    Spouse name: Not on file  . Number of children: Not on file  . Years of education: Not on file  . Highest education level: Not on file  Occupational History  . Not on file  Tobacco Use  . Smoking status: Never Smoker  . Smokeless tobacco: Never Used  Vaping Use  . Vaping Use: Never used  Substance and Sexual Activity  . Alcohol use: Never  . Drug use: Never  . Sexual activity: Yes    Birth control/protection: None  Other Topics Concern  . Not on file  Social History Narrative  . Not on file   Social Determinants of Health   Financial Resource Strain:   . Difficulty of Paying Living Expenses: Not on file  Food Insecurity:   . Worried About Charity fundraiser in the Last Year: Not on file  . Ran Out of Food in the Last Year: Not on file  Transportation Needs:   . Lack of Transportation (Medical): Not on file  . Lack of Transportation (Non-Medical): Not on file  Physical Activity:   . Days of  Exercise per Week: Not on file  . Minutes of Exercise per Session: Not on file  Stress:   . Feeling of Stress : Not on file  Social Connections:   . Frequency of Communication with Friends and Family: Not on file  . Frequency of Social Gatherings with Friends and Family: Not on file  . Attends Religious Services: Not on file  . Active Member of Clubs or Organizations: Not on file  . Attends Archivist Meetings: Not on file  . Marital Status: Not on file  Intimate Partner Violence:   . Fear of Current or Ex-Partner: Not on file  . Emotionally Abused: Not on file  . Physically Abused: Not on file  . Sexually Abused: Not on file    Outpatient Medications Prior to Visit  Medication Sig Dispense Refill  . ACCU-CHEK FASTCLIX LANCETS MISC 1 Device by Percutaneous route 4 (four) times daily. 100 each 12  . AMBULATORY NON FORMULARY MEDICATION 1 Device by Other route once a week. Blood pressure cuff medium size. Monitor regularly at home ICD 10 dx: O09.90 1 Device 0  . blood glucose meter kit and supplies Dispense based on patient and insurance preference. Use up to four times daily as directed. (FOR ICD-10 E10.9, E11.9). 1 each  0  . Blood Pressure Monitoring MISC 1 Device by Does not apply route daily. 1 Device 0  . glipiZIDE (GLUCOTROL) 10 MG tablet Take 1 tablet (10 mg total) by mouth 2 (two) times daily before a meal. 60 tablet 3  . glucose blood (ACCU-CHEK GUIDE) test strip Use as instructed QID 100 each 12  . insulin aspart (NOVOLOG) 100 UNIT/ML injection Inject 10 Units into the skin 3 (three) times daily before meals. For blood sugars 0-199  give 0 units of insulin, 200 -250 give  4 units of insulin, 251- 300 give 6 units, 301- 350 give 8units, 351-400  give 8 units, 401-450  give 10 units,> 450 give 12 units and call M.D. Discussed hypoglycemia protocol. 10 mL PRN  . Insulin Syringe-Needle U-100 (INSULIN SYRINGE .5CC/30GX5/16") 30G X 5/16" 0.5 ML MISC 1 Device by Does not apply  route as directed. 100 each 5  . metFORMIN (GLUCOPHAGE) 1000 MG tablet Take 1 tablet (1,000 mg total) by mouth 2 (two) times daily with a meal. 180 tablet 3  . pantoprazole (PROTONIX) 20 MG tablet Take 1 tablet (20 mg total) by mouth daily. 90 tablet 1  . enalapril (VASOTEC) 10 MG tablet Take 1 tablet (10 mg total) by mouth daily. 90 tablet 0  . ferrous sulfate (FERROUSUL) 325 (65 FE) MG tablet Take 1 tablet (325 mg total) by mouth daily with breakfast. 30 tablet 0   No facility-administered medications prior to visit.    Allergies  Allergen Reactions  . Penicillins     Did it involve swelling of the face/tongue/throat, SOB, or low BP? Unknown Did it involve sudden or severe rash/hives, skin peeling, or any reaction on the inside of your mouth or nose? Unknown Did you need to seek medical attention at a hospital or doctor's office? Unknown When did it last happen?childhood If all above answers are "NO", may proceed with cephalosporin use.     ROS Review of Systems  Respiratory: Positive for cough.   All other systems reviewed and are negative.     Objective:    BP 132/84 (BP Location: Right Arm, Patient Position: Sitting, Cuff Size: Normal)   Pulse 96   Temp (!) 97.5 F (36.4 C) (Temporal)   Ht 5' 6"  (1.676 m)   Wt 141 lb 12.8 oz (64.3 kg)   LMP 03/08/2020 (Exact Date)   SpO2 100%   BMI 22.89 kg/m   Wt Readings from Last 3 Encounters:  03/30/20 141 lb 12.8 oz (64.3 kg)  03/16/20 147 lb 6.4 oz (66.9 kg)  01/06/20 159 lb 12.8 oz (72.5 kg)   Physical Exam CONSTITUTIONAL: Well-developed, well-nourished female in no acute distress.  HENT:  Normocephalic, atraumatic, External right and left ear normal. EYES: Conjunctivae and EOM are normal. Pupils are equal, round, and reactive to light. No scleral icterus.  NECK: Normal range of motion, supple, no masses.  Normal thyroid.  SKIN: Skin is warm and dry. No rash noted. Not diaphoretic. No erythema. No pallor. [place]:  Alert and oriented to person, place, and time. Normal reflexes, muscle tone coordination. No cranial nerve deficit noted. PSYCHIATRIC: Normal mood and affect. Normal behavior. Normal judgment and thought content. CARDIOVASCULAR: Normal heart rate noted, regular rhythm RESPIRATORY: Clear to auscultation bilaterally. Effort and breath sounds normal, no problems with respiration noted. BREASTS: Taught SBE and return demonstration  ABDOMEN: Soft, normal bowel sounds, no distention noted.  No tenderness, rebound or guarding.  PELVIC: Normal appearing external genitalia; normal appearing vaginal mucosa and cervix.  No abnormal discharge noted.  Pap smear obtained.  Normal uterine size, no other palpable masses, no uterine or adnexal tenderness. MUSCULOSKELETAL: Normal range of motion. No tenderness.  No cyanosis, clubbing, or edema.  2+ distal pulses.  Health Maintenance Due  Topic Date Due  . PNEUMOCOCCAL POLYSACCHARIDE VACCINE AGE 34-64 HIGH RISK  Never done  . OPHTHALMOLOGY EXAM  Never done  . COVID-19 Vaccine (1) Never done    There are no preventive care reminders to display for this patient.  Lab Results  Component Value Date   TSH 0.946 06/28/2018   Lab Results  Component Value Date   WBC 6.3 03/30/2020   HGB 10.4 (L) 03/30/2020   HCT 36.3 03/30/2020   MCV 71 (L) 03/30/2020   PLT 176 03/30/2020   Lab Results  Component Value Date   NA 136 03/30/2020   K 4.0 03/30/2020   CO2 24 03/30/2020   GLUCOSE 303 (H) 03/30/2020   BUN 11 03/30/2020   CREATININE 0.71 03/30/2020   BILITOT 0.8 03/30/2020   ALKPHOS 62 03/30/2020   AST 11 03/30/2020   ALT 14 03/30/2020   PROT 7.3 03/30/2020   ALBUMIN 4.2 03/30/2020   CALCIUM 8.9 03/30/2020   ANIONGAP 6 11/27/2018   Lab Results  Component Value Date   CHOL 183 03/30/2020   Lab Results  Component Value Date   HDL 64 03/30/2020   Lab Results  Component Value Date   LDLCALC 105 (H) 03/30/2020   Lab Results  Component Value Date    TRIG 78 03/30/2020   Lab Results  Component Value Date   CHOLHDL 2.9 03/30/2020   Lab Results  Component Value Date   HGBA1C 9.6 (A) 01/06/2020      Assessment & Plan:  Lisa Blake was seen today for gynecologic exam.  Diagnoses and all orders for this visit:  Vision changes -     Ambulatory referral to Ophthalmology  Type 2 diabetes mellitus without complication, with long-term current use of insulin ([place])  Your A1C is a measure of your sugar over the past 3 months and is not affected by what you have eaten over the past few days.. Please make sure you decrease bad carbs like white bread, white rice, potatoes, corn, soft drinks, pasta, cereals, refined sugars, sweet tea, dried fruits, and fruit juice. Good carbs are okay to eat in moderation like sweet potatoes, brown rice, whole grain pasta/bread, most fruit (except dried fruit) and you can eat as many veggies as you want.   Greater than 6.5 is considered diabetic. Between 6.4 and 5.7 is prediabetic If your A1C is less than 5.7 you are NOT diabetic.  Targets for Glucose Readings: Time of Check Target for patients WITHOUT Diabetes Target for DIABETICS  Before Meals Less than 100  less than 150  Two hours after meals Less than 200  Less than 250   -     CBC with Differential -     Microalbumin, urine  Essential hypertension Blood pressure goal of less than 130/80, low-sodium, DASH diet, medication compliance, 150 minutes of moderate intensity exercise per week. Discussed medication compliance, adverse effects. Changed Lisinopril to arb- losartan 34m daily  -     CMP14+EGFR  Mixed hyperlipidemia Your LDL is not in range. Your LDL is the bad cholesterol that can lead to heart attack and stroke. To lower your number you can decrease your fatty foods, red meat, cheese, milk and increase fiber like whole grains and veggies.  -  Lipid Panel  Cervical cancer screening -     Cytology - PAP([place])  Screening for STD  (sexually transmitted disease) -     Cervicovaginal ancillary only  Other orders -     losartan (COZAAR) 25 MG tablet; Take 1 tablet (25 mg total) by mouth daily. -     insulin glargine (LANTUS SOLOSTAR) 100 UNIT/ML Solostar Pen; Inject 12 Units into the skin daily.    Meds ordered this encounter  Medications  . losartan (COZAAR) 25 MG tablet    Sig: Take 1 tablet (25 mg total) by mouth daily.    Dispense:  30 tablet    Refill:  1  . insulin glargine (LANTUS SOLOSTAR) 100 UNIT/ML Solostar Pen    Sig: Inject 12 Units into the skin daily.    Dispense:  15 mL    Refill:  PRN    Follow-up: Return in about 3 months (around 06/28/2020) for DM/HTN.    Kerin Perna, NP

## 2020-04-06 ENCOUNTER — Other Ambulatory Visit (INDEPENDENT_AMBULATORY_CARE_PROVIDER_SITE_OTHER): Payer: Self-pay | Admitting: Primary Care

## 2020-04-06 MED ORDER — PRAVASTATIN SODIUM 10 MG PO TABS: 20.0000 mg | ORAL_TABLET | Freq: Every day | ORAL | 1 refills | Status: DC

## 2020-04-06 MED ORDER — FERROUS SULFATE 325 (65 FE) MG PO TABS: 325.0000 mg | ORAL_TABLET | Freq: Every day | ORAL | 1 refills | Status: DC

## 2020-04-13 ENCOUNTER — Ambulatory Visit (INDEPENDENT_AMBULATORY_CARE_PROVIDER_SITE_OTHER): Payer: Medicaid Other | Admitting: Primary Care

## 2020-05-19 ENCOUNTER — Ambulatory Visit (INDEPENDENT_AMBULATORY_CARE_PROVIDER_SITE_OTHER): Payer: Medicaid Other | Admitting: Primary Care

## 2020-05-21 ENCOUNTER — Other Ambulatory Visit (INDEPENDENT_AMBULATORY_CARE_PROVIDER_SITE_OTHER): Payer: Self-pay | Admitting: Primary Care

## 2020-05-21 DIAGNOSIS — I10 Essential (primary) hypertension: Secondary | ICD-10-CM

## 2020-06-08 ENCOUNTER — Other Ambulatory Visit: Payer: Self-pay

## 2020-06-08 ENCOUNTER — Encounter (INDEPENDENT_AMBULATORY_CARE_PROVIDER_SITE_OTHER): Payer: Self-pay | Admitting: Primary Care

## 2020-06-08 ENCOUNTER — Telehealth (INDEPENDENT_AMBULATORY_CARE_PROVIDER_SITE_OTHER): Payer: Medicaid Other | Admitting: Primary Care

## 2020-06-08 VITALS — BP 127/86 | HR 91 | Wt 149.6 lb

## 2020-06-08 DIAGNOSIS — I10 Essential (primary) hypertension: Secondary | ICD-10-CM

## 2020-06-08 DIAGNOSIS — E1165 Type 2 diabetes mellitus with hyperglycemia: Secondary | ICD-10-CM

## 2020-06-08 DIAGNOSIS — E782 Mixed hyperlipidemia: Secondary | ICD-10-CM

## 2020-06-08 NOTE — Progress Notes (Deleted)
Virtual Visit via Telephone Note  I connected with Lisa Blake on 06/08/20 at  8:30 AM EST by telephone and verified that I am speaking with the correct person using two identifiers.  Location: Patient: office for labs tele for visit  Provider: Grayce Sessions    I discussed the limitations, risks, security and privacy concerns of performing an evaluation and management service by telephone and the availability of in person appointments. I also discussed with the patient that there may be a patient responsible charge related to this service. The patient expressed understanding and agreed to proceed.   History of Present Illness:    Observations/Objective:   Assessment and Plan:   Follow Up Instructions:    I discussed the assessment and treatment plan with the patient. The patient was provided an opportunity to ask questions and all were answered. The patient agreed with the plan and demonstrated an understanding of the instructions.   The patient was advised to call back or seek an in-person evaluation if the symptoms worsen or if the condition fails to improve as anticipated.  I provided *** minutes of non-face-to-face time during this encounter.   Grayce Sessions, NP

## 2020-06-09 LAB — LIPID PANEL
Chol/HDL Ratio: 2.1 ratio (ref 0.0–4.4)
Cholesterol, Total: 138 mg/dL (ref 100–199)
HDL: 67 mg/dL (ref >39)
LDL Chol Calc (NIH): 58 mg/dL (ref 0–99)
Triglycerides: 61 mg/dL (ref 0–149)
VLDL Cholesterol Cal: 13 mg/dL (ref 5–40)

## 2020-06-09 LAB — CBC WITH DIFFERENTIAL/PLATELET
Basophils Absolute: 0.1 10*3/uL (ref 0.0–0.2)
Basos: 1 %
EOS (ABSOLUTE): 0.2 10*3/uL (ref 0.0–0.4)
Eos: 2 %
Hematocrit: 32.4 % — ABNORMAL LOW (ref 34.0–46.6)
Hemoglobin: 9.6 g/dL — ABNORMAL LOW (ref 11.1–15.9)
Immature Grans (Abs): 0 10*3/uL (ref 0.0–0.1)
Immature Granulocytes: 0 %
Lymphocytes Absolute: 2.3 10*3/uL (ref 0.7–3.1)
Lymphs: 34 %
MCH: 20.7 pg — ABNORMAL LOW (ref 26.6–33.0)
MCHC: 29.6 g/dL — ABNORMAL LOW (ref 31.5–35.7)
MCV: 70 fL — ABNORMAL LOW (ref 79–97)
Monocytes Absolute: 0.6 10*3/uL (ref 0.1–0.9)
Monocytes: 9 %
Neutrophils Absolute: 3.7 10*3/uL (ref 1.4–7.0)
Neutrophils: 54 %
Platelets: 356 10*3/uL (ref 150–450)
RBC: 4.64 x10E6/uL (ref 3.77–5.28)
RDW: 18.2 % — ABNORMAL HIGH (ref 11.7–15.4)
WBC: 6.9 10*3/uL (ref 3.4–10.8)

## 2020-06-09 LAB — CMP14+EGFR
ALT: 11 IU/L (ref 0–32)
AST: 16 IU/L (ref 0–40)
Albumin/Globulin Ratio: 1.5 (ref 1.2–2.2)
Albumin: 4 g/dL (ref 3.8–4.8)
Alkaline Phosphatase: 51 IU/L (ref 44–121)
BUN/Creatinine Ratio: 16 (ref 9–23)
BUN: 10 mg/dL (ref 6–20)
Bilirubin Total: 0.6 mg/dL (ref 0.0–1.2)
CO2: 21 mmol/L (ref 20–29)
Calcium: 9.1 mg/dL (ref 8.7–10.2)
Chloride: 105 mmol/L (ref 96–106)
Creatinine, Ser: 0.64 mg/dL (ref 0.57–1.00)
GFR calc Af Amer: 130 mL/min/{1.73_m2} (ref >59)
GFR calc non Af Amer: 113 mL/min/{1.73_m2} (ref >59)
Globulin, Total: 2.7 g/dL (ref 1.5–4.5)
Glucose: 69 mg/dL (ref 65–99)
Potassium: 3.8 mmol/L (ref 3.5–5.2)
Sodium: 141 mmol/L (ref 134–144)
Total Protein: 6.7 g/dL (ref 6.0–8.5)

## 2020-06-09 LAB — HEMOGLOBIN A1C
Est. average glucose Bld gHb Est-mCnc: 301 mg/dL
Hgb A1c MFr Bld: 12.1 % — ABNORMAL HIGH (ref 4.8–5.6)

## 2020-06-20 ENCOUNTER — Other Ambulatory Visit (INDEPENDENT_AMBULATORY_CARE_PROVIDER_SITE_OTHER): Payer: Self-pay | Admitting: Primary Care

## 2020-06-20 DIAGNOSIS — O24011 Pre-existing diabetes mellitus, type 1, in pregnancy, first trimester: Secondary | ICD-10-CM

## 2020-06-20 DIAGNOSIS — E782 Mixed hyperlipidemia: Secondary | ICD-10-CM

## 2020-06-20 DIAGNOSIS — D509 Iron deficiency anemia, unspecified: Secondary | ICD-10-CM

## 2020-06-20 DIAGNOSIS — E119 Type 2 diabetes mellitus without complications: Secondary | ICD-10-CM

## 2020-06-20 MED ORDER — FERROUS SULFATE 325 (65 FE) MG PO TABS: 325.0000 mg | ORAL_TABLET | Freq: Every day | ORAL | 1 refills | Status: AC

## 2020-06-20 MED ORDER — METFORMIN HCL 1000 MG PO TABS: 1000.0000 mg | ORAL_TABLET | Freq: Two times a day (BID) | ORAL | 3 refills | Status: AC

## 2020-06-20 MED ORDER — GLIPIZIDE 10 MG PO TABS: 10.0000 mg | ORAL_TABLET | Freq: Two times a day (BID) | ORAL | 3 refills | Status: AC

## 2020-06-20 MED ORDER — PRAVASTATIN SODIUM 10 MG PO TABS: 10.0000 mg | ORAL_TABLET | Freq: Every day | ORAL | 1 refills | Status: AC

## 2020-06-20 MED ORDER — LISINOPRIL 5 MG PO TABS: 5.0000 mg | ORAL_TABLET | Freq: Every day | ORAL | 3 refills | Status: AC

## 2020-06-20 MED ORDER — INSULIN ASPART 100 UNIT/ML ~~LOC~~ SOLN: 10.0000 [IU] | Freq: Three times a day (TID) | SUBCUTANEOUS | 99 refills | Status: AC

## 2020-06-20 MED ORDER — "INSULIN SYRINGE 30G X 5/16"" 0.5 ML MISC": 1.0000 | 5 refills | Status: AC

## 2020-06-20 MED ORDER — SENNA 8.6 MG PO TABS: 1.0000 | ORAL_TABLET | Freq: Every day | ORAL | 1 refills | Status: AC | PRN

## 2020-06-20 MED ORDER — LANTUS SOLOSTAR 100 UNIT/ML ~~LOC~~ SOPN: 16.0000 [IU] | PEN_INJECTOR | Freq: Every day | SUBCUTANEOUS | 99 refills | Status: DC

## 2020-06-20 MED ORDER — ACCU-CHEK FASTCLIX LANCETS MISC: 1.0000 | Freq: Four times a day (QID) | 12 refills | Status: AC

## 2020-06-20 NOTE — Progress Notes (Signed)
Telephone Note  I connected with Lisa Blake on 06/20/20 at  by telephone and verified that I am speaking with the correct person using two identifiers.  Location: Patient: home Provider: Kerin Perna   I discussed the limitations, risks, security and privacy concerns of performing an evaluation and management service by telephone and the availability of in person appointments. I also discussed with the patient that there may be a patient responsible charge related to this service. The patient expressed understanding and agreed to proceed.   History of Present Illness: [name] is a 40 year Hispanic female having tele visit for the management of type 2 diabetes she admits to eating cookies.  DIABETES Hypoglycemic episodes:no, Polydipsia/polyuria: no, Visual disturbance: no, Chest pain: no, Paresthesias: yes resolved after exercising and walking  Glucose Monitoring: yes, Accucheck frequency: TID,Fasting glucose: 100 - 250.  Reviewed medication with patient only taking Metformin once a day prescribed at 1000 mg twice daily she understands the need to take twice a day and continue her glipizide 10 mg twice a day, increase her insulin to 16 units.  Blood pressure is unremarkable-Denies shortness of breath, headaches, chest pain or lower extremity edema.  Past Medical History:  Diagnosis Date  . Diabetes mellitus without complication ([place])   . DM II (diabetes mellitus, type II), controlled ([place])    Diagnosed after pregnancy, was 40 years old   Current Outpatient Medications on File Prior to Visit  Medication Sig Dispense Refill  . AMBULATORY NON FORMULARY MEDICATION 1 Device by Other route once a week. Blood pressure cuff medium size. Monitor regularly at home ICD 10 dx: O09.90 1 Device 0  . blood glucose meter kit and supplies Dispense based on patient and insurance preference. Use up to four times daily as directed. (FOR ICD-10 E10.9, E11.9). 1 each 0   . Blood Pressure Monitoring MISC 1 Device by Does not apply route daily. 1 Device 0  . glucose blood (ACCU-CHEK GUIDE) test strip Use as instructed QID 100 each 12  . pantoprazole (PROTONIX) 20 MG tablet Take 1 tablet (20 mg total) by mouth daily. 90 tablet 1   No current facility-administered medications on file prior to visit.   Observations/Objective: Bp 127/86 , wt 149 All pertinent positives and negatives as noted in HPI  Assessment and Plan: Diagnoses and all orders for this visit:  Mixed hyperlipidemia On statin per ADA guidelines pravastatin 20.  After labs are reviewed reevaluate dose. Lipid fasting  Type 2 diabetes mellitus without complication, with long-term current use of insulin ([place]) She will be taking Metformin twice a day since once daily A1c has increased from 40.6-12.1 in the wrong direction increase insulin from 12 units to 16 units at bedtime discussed sliding scale.  Complications from uncontrolled diabetes -diabetic retinopathy leading to blindness, diabetic nephropathy leading to dialysis, decrease in circulation decrease in sores or wound healing which may lead to amputations and increase of heart attack and stroke.  Also reemphasized diet low carbohydrates decrease rice, potatoes, tortillas, fajitas, breads, sodas and sugar. -     metFORMIN (GLUCOPHAGE) 1000 MG tablet; Take 1 tablet (1,000 mg total) by mouth 2 (two) times daily with a meal. -     Accu-Chek FastClix Lancets MISC; 1 Device by Percutaneous route 4 (four) times daily. -     glipiZIDE (GLUCOTROL) 10 MG tablet; Take 1 tablet (10 mg total) by mouth 2 (two) times daily before a meal. -     insulin  aspart (NOVOLOG) 100 UNIT/ML injection; Inject 10 Units into the skin 3 (three) times daily before meals. For blood sugars 0-199  give 0 units of insulin, 200 -250 give  4 units of insulin, 251- 300 give 6 units, 301- 350 give 8units, 351-400  give 8 units, 401-450  give 10 units,> 450 give 12 units and call  M.D. Discussed hypoglycemia protocol. -     lisinopril (ZESTRIL) 5 MG tablet; Take 1 tablet (5 mg total) by mouth daily.  Iron deficiency anemia, unspecified iron deficiency anemia type Patient admitted to stop taking iron supplement.  Labs indicate needing to take daily.  And increase iron in diet. Iron rich foods such as shellfish,liver, organ meats(liver, gizzard), and red meats can increase cholesterol and should be consumed in moderation.However; legumes(beans), spinach, pumpkin seeds, [place], broccoli, tofu, green leafy vegetables and dark chocolate can be consumed without concern to cholesterol.  -     ferrous sulfate (FERROUSUL) 325 (65 FE) MG tablet; Take 1 tablet (325 mg total) by mouth daily with breakfast.  Other orders -     insulin glargine (LANTUS SOLOSTAR) 100 UNIT/ML Solostar Pen; Inject 16 Units into the skin at bedtime.    Follow Up Instructions: Follow-up in 3 months for type 2 diabetes and A1c   I discussed the assessment and treatment plan with the patient. The patient was provided an opportunity to ask questions and all were answered. The patient agreed with the plan and demonstrated an understanding of the instructions.   The patient was advised to call back or seek an in-person evaluation if the symptoms worsen or if the condition fails to improve as anticipated.  I provided 30 minutes of non-face-to-face time during this encounter.   Kerin Perna, NP

## 2020-06-20 NOTE — Progress Notes (Signed)
DELETE 

## 2020-06-22 ENCOUNTER — Other Ambulatory Visit (INDEPENDENT_AMBULATORY_CARE_PROVIDER_SITE_OTHER): Payer: Self-pay | Admitting: Primary Care

## 2020-06-22 DIAGNOSIS — O24410 Gestational diabetes mellitus in pregnancy, diet controlled: Secondary | ICD-10-CM

## 2020-08-05 ENCOUNTER — Telehealth (INDEPENDENT_AMBULATORY_CARE_PROVIDER_SITE_OTHER): Payer: Self-pay | Admitting: Primary Care

## 2020-08-05 NOTE — Telephone Encounter (Signed)
Per initial encounter: Copied from [place] [id number]. Topic: Quick Communication - Rx Refill/Question >> Aug 05, 2020  1:07 PM Mcneil, Ja-Kwan wrote: Medication: blood glucose meter kit and supplies and Blood Pressure Monitoring MISC  Has the patient contacted their pharmacy? yes - Pt told to call provider  Preferred Pharmacy (with phone number or street name): [place] ([place]), [place] - [address]  Phone: [phone]   Fax: [phone]  Agent: Please be advised that RX refills may take up to 3 business days. We ask that you follow-up with your pharmacy.   Will route to office for final disposition

## 2020-08-05 NOTE — Telephone Encounter (Signed)
Copied from [place] [id number]. Topic: Quick Communication - Rx Refill/Question >> Aug 05, 2020  1:07 PM Mcneil, Ja-Kwan wrote: Medication: blood glucose meter kit and supplies and Blood Pressure Monitoring MISC  Has the patient contacted their pharmacy? yes - Pt told to call provider  Preferred Pharmacy (with phone number or street name): [place] ([place]), [place] - [address]  Phone: [phone]   Fax: [phone]  Agent: Please be advised that RX refills may take up to 3 business days. We ask that you follow-up with your pharmacy.

## 2020-08-25 ENCOUNTER — Other Ambulatory Visit (INDEPENDENT_AMBULATORY_CARE_PROVIDER_SITE_OTHER): Payer: Self-pay | Admitting: Primary Care

## 2020-11-26 ENCOUNTER — Other Ambulatory Visit (INDEPENDENT_AMBULATORY_CARE_PROVIDER_SITE_OTHER): Payer: Self-pay | Admitting: Primary Care

## 2020-11-26 NOTE — Telephone Encounter (Signed)
Medication: insulin glargine (LANTUS SOLOSTAR) 100 UNIT/ML Solostar Pen [[id number]] , pantoprazole (PROTONIX) 20 MG tablet [[id number]]  DISCONTINUED  Has the patient contacted their pharmacy? YES (Agent: If no, request that the patient contact the pharmacy for the refill.) (Agent: If yes, when and what did the pharmacy advise?)  Preferred Pharmacy (with phone number or street name): [id number] - [place] ([place]), [place] - [address] [place] ([place]) [place] [address] Phone: [phone] Fax: [phone] Hours: Not open 24 hours    Agent: Please be advised that RX refills may take up to 3 business days. We ask that you follow-up with your pharmacy.

## 2020-11-26 NOTE — Telephone Encounter (Signed)
   Notes to clinic:  Patient is also requesting Pantoprazole but not on current medication list   Requested Prescriptions  Pending Prescriptions Disp Refills   insulin glargine (LANTUS SOLOSTAR) 100 UNIT/ML Solostar Pen 15 mL PRN    Sig: Inject 16 Units into the skin at bedtime.      Endocrinology:  Diabetes - Insulins Failed - 11/26/2020  1:48 PM      Failed - HBA1C is between 0 and 7.9 and within 180 days    Hgb A1c MFr Bld  Date Value Ref Range Status  06/08/2020 12.1 (H) 4.8 - 5.6 % Final    Comment:             Prediabetes: 5.7 - 6.4          Diabetes: >6.4          Glycemic control for adults with diabetes: <7.0           Passed - Valid encounter within last 6 months    Recent Outpatient Visits           5 months ago Uncontrolled type 2 diabetes mellitus with hyperglycemia ([facility])   [facility] Grayce Sessions, NP   8 months ago Vision changes   [facility] Gwinda Passe P, NP   8 months ago Gastroesophageal reflux disease without esophagitis   [facility] Gwinda Passe P, NP   10 months ago Type 2 diabetes mellitus without complication, without long-term current use of insulin ([facility])   [facility] Gwinda Passe P, NP   1 year ago Type 2 diabetes mellitus without complication, without long-term current use of insulin ([facility])   [facility] Grayce Sessions, NP

## 2020-11-30 ENCOUNTER — Ambulatory Visit: Payer: Self-pay

## 2020-11-30 MED ORDER — LANTUS SOLOSTAR 100 UNIT/ML ~~LOC~~ SOPN: 16.0000 [IU] | PEN_INJECTOR | Freq: Every day | SUBCUTANEOUS | 0 refills | Status: AC

## 2020-11-30 NOTE — Telephone Encounter (Signed)
Pt called stating that she has a white discharge with some odor. Pt has appt scheduled for 12/20/20 and does not think that she will last until then and is requesting to have some OTC medication advice. Please advise.

## 2020-11-30 NOTE — Telephone Encounter (Signed)
3rd  attempt to reach pt, VM full per interpreter [facility]  # [phone]

## 2020-11-30 NOTE — Telephone Encounter (Signed)
Using [employer] [phone]. Patient called, no answer, mailbox is full.  Message from Gwenlyn Fudge sent at 11/30/2020  2:44 PM EDT  Pt called stating that she has a white discharge with some odor. Pt has appt scheduled for 12/20/20 and does not think that she will last until then and is requesting to have some OTC medication advice. Please advise.

## 2020-11-30 NOTE — Telephone Encounter (Signed)
No refills last A1C > 6 months needs blood work

## 2020-12-20 ENCOUNTER — Ambulatory Visit (INDEPENDENT_AMBULATORY_CARE_PROVIDER_SITE_OTHER): Payer: Medicaid Other | Admitting: Primary Care
